# Patient Record
Sex: Female | Born: 1970 | Race: Black or African American | Hispanic: No | Marital: Married | Smoking: Never smoker
Health system: Southern US, Community
[De-identification: ages and names within clinical notes are randomized; demographics above are authoritative.]

## PROBLEM LIST (undated history)

## (undated) DIAGNOSIS — J302 Other seasonal allergic rhinitis: Secondary | ICD-10-CM

## (undated) DIAGNOSIS — Z9889 Other specified postprocedural states: Secondary | ICD-10-CM

## (undated) DIAGNOSIS — Z332 Encounter for elective termination of pregnancy: Secondary | ICD-10-CM

## (undated) DIAGNOSIS — I10 Essential (primary) hypertension: Secondary | ICD-10-CM

## (undated) DIAGNOSIS — R112 Nausea with vomiting, unspecified: Secondary | ICD-10-CM

## (undated) HISTORY — PX: BREAST LUMPECTOMY: SHX2

## (undated) HISTORY — DX: Essential (primary) hypertension: I10

## (undated) HISTORY — PX: CHOLECYSTECTOMY: SHX55

---
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
## 2003-06-14 HISTORY — PX: BREAST EXCISIONAL BIOPSY: SUR124

## 2011-05-16 LAB — LIPID PANEL
HDL: 59 mg/dL (ref 35–70)
LDL Cholesterol: 98 mg/dL

## 2011-05-23 ENCOUNTER — Other Ambulatory Visit (HOSPITAL_BASED_OUTPATIENT_CLINIC_OR_DEPARTMENT_OTHER): Payer: Self-pay | Admitting: Unknown Physician Specialty

## 2011-05-23 DIAGNOSIS — Z1231 Encounter for screening mammogram for malignant neoplasm of breast: Secondary | ICD-10-CM

## 2011-05-24 ENCOUNTER — Ambulatory Visit (HOSPITAL_BASED_OUTPATIENT_CLINIC_OR_DEPARTMENT_OTHER)
Admission: RE | Admit: 2011-05-24 | Discharge: 2011-05-24 | Disposition: A | Discharge status: Home / Self Care | Payer: Private Health Insurance - Indemnity | Source: Ambulatory Visit | Attending: Unknown Physician Specialty | Admitting: Unknown Physician Specialty

## 2011-05-24 DIAGNOSIS — Z1231 Encounter for screening mammogram for malignant neoplasm of breast: Secondary | ICD-10-CM | POA: Insufficient documentation

## 2011-05-26 ENCOUNTER — Other Ambulatory Visit: Payer: Self-pay | Admitting: Unknown Physician Specialty

## 2011-05-26 DIAGNOSIS — R928 Other abnormal and inconclusive findings on diagnostic imaging of breast: Secondary | ICD-10-CM

## 2011-06-09 ENCOUNTER — Ambulatory Visit
Admission: RE | Admit: 2011-06-09 | Discharge: 2011-06-09 | Disposition: A | Discharge status: Home / Self Care | Payer: Private Health Insurance - Indemnity | Source: Ambulatory Visit | Attending: Unknown Physician Specialty | Admitting: Unknown Physician Specialty

## 2011-06-09 DIAGNOSIS — R928 Other abnormal and inconclusive findings on diagnostic imaging of breast: Secondary | ICD-10-CM

## 2011-09-13 ENCOUNTER — Ambulatory Visit (INDEPENDENT_AMBULATORY_CARE_PROVIDER_SITE_OTHER): Payer: Private Health Insurance - Indemnity | Admitting: Physician Assistant

## 2011-09-13 ENCOUNTER — Encounter: Payer: Self-pay | Admitting: Physician Assistant

## 2011-09-13 VITALS — BP 138/91 | HR 78 | Wt 275.0 lb

## 2011-09-13 DIAGNOSIS — Z23 Encounter for immunization: Secondary | ICD-10-CM

## 2011-09-13 DIAGNOSIS — I10 Essential (primary) hypertension: Secondary | ICD-10-CM

## 2011-09-13 DIAGNOSIS — Z0000 Encounter for general adult medical examination without abnormal findings: Secondary | ICD-10-CM

## 2011-09-13 DIAGNOSIS — R5383 Other fatigue: Secondary | ICD-10-CM

## 2011-09-13 DIAGNOSIS — R635 Abnormal weight gain: Secondary | ICD-10-CM

## 2011-09-13 MED ORDER — LOSARTAN POTASSIUM 50 MG PO TABS: 50.0000 mg | ORAL_TABLET | Freq: Every day | ORAL | Status: DC

## 2011-09-13 NOTE — Patient Instructions (Addendum)
Will get TSH and call with results. Keep a balanced diet low in carbs and high in protein. Keep doing Zumba and add another day if possible. Watch sodium intake especially when going out to eat. Start losartan and follow up in 2-3 months for bp recheck. Start Calcium 500mg  twice a day or 4 servings of dairy daily. Bring in labs drawn to scan into our system.  Gave Tdap.  Calorie Counting Diet A calorie counting diet requires you to eat the number of calories that are right for you in a day. Calories are the measurement of how much energy you get from the food you eat. Eating the right amount of calories is important for staying at a healthy weight. If you eat too many calories, your body will store them as fat and you may gain weight. If you eat too few calories, you may lose weight. Counting the number of calories you eat during a day will help you know if you are eating the right amount. A Registered Dietitian can determine how many calories you need in a day. The amount of calories needed varies from person to person. If your goal is to lose weight, you will need to eat fewer calories. Losing weight can benefit you if you are overweight or have health problems such as heart disease, high blood pressure, or diabetes. If your goal is to gain weight, you will need to eat more calories. Gaining weight may be necessary if you have a certain health problem that causes your body to need more energy. TIPS Whether you are increasing or decreasing the number of calories you eat during a day, it may be hard to get used to changes in what you eat and drink. The following are tips to help you keep track of the number of calories you eat.  Measure foods at home with measuring cups. This helps you know the amount of food and number of calories you are eating.   Restaurants often serve food in amounts that are larger than 1 serving. While eating out, estimate how many servings of a food you are given. For example, a  serving of cooked rice is  cup or about the size of half of a fist. Knowing serving sizes will help you be aware of how much food you are eating at restaurants.   Ask for smaller portion sizes or child-size portions at restaurants.   Plan to eat half of a meal at a restaurant. Take the rest home or share the other half with a friend.   Read the Nutrition Facts panel on food labels for calorie content and serving size. You can find out how many servings are in a package, the size of a serving, and the number of calories each serving has.   For example, a package might contain 3 cookies. The Nutrition Facts panel on that package says that 1 serving is 1 cookie. Below that, it will say there are 3 servings in the container. The calories section of the Nutrition Facts label says there are 90 calories. This means there are 90 calories in 1 cookie (1 serving). If you eat 1 cookie you have eaten 90 calories. If you eat all 3 cookies, you have eaten 270 calories (3 servings x 90 calories = 270 calories).  The list below tells you how big or small some common portion sizes are.  1 oz.........4 stacked dice.   3 oz........Marland KitchenDeck of cards.   1 tsp.......Marland KitchenTip of little finger.  1 tbs......Marland KitchenMarland KitchenThumb.   2 tbs.......Marland KitchenGolf ball.    cup......Marland KitchenHalf of a fist.   1 cup.......Marland KitchenA fist.  KEEP A FOOD LOG Write down every food item you eat, the amount you eat, and the number of calories in each food you eat during the day. At the end of the day, you can add up the total number of calories you have eaten. It may help to keep a list like the one below. Find out the calorie information by reading the Nutrition Facts panel on food labels. Breakfast  Bran cereal (1 cup, 110 calories).   Fat-free milk ( cup, 45 calories).  Snack  Apple (1 medium, 80 calories).  Lunch  Spinach (1 cup, 20 calories).   Tomato ( medium, 20 calories).   Chicken breast strips (3 oz, 165 calories).   Shredded cheddar cheese (  cup, 110 calories).   Light [place] dressing (2 tbs, 60 calories).   Whole-wheat bread (1 slice, 80 calories).   Tub margarine (1 tsp, 35 calories).   Vegetable soup (1 cup, 160 calories).  Dinner  Pork chop (3 oz, 190 calories).   Brown rice (1 cup, 215 calories).   Steamed broccoli ( cup, 20 calories).   Strawberries (1  cup, 65 calories).   Whipped cream (1 tbs, 50 calories).  Daily Calorie Total: 1425 Document Released: 05/30/2005 Document Revised: 05/19/2011 Document Reviewed: 11/24/2006 [facility] Patient Information 2012 [place], [place].

## 2011-09-13 NOTE — Progress Notes (Signed)
Subjective:    Patient ID: Desiree Lawson, female    DOB: [birth year], 41 y.o.   MRN: [id number]  HPI    Review of Systems     Objective:   Physical Exam        Assessment & Plan:   Subjective:     Desiree Lawson is a 41 y.o. female and is here for a comprehensive physical exam. The patient reports problems - she has not taken her rampril due to cough and cramps in 6 months and she is concerned with her weight gain and faitgue.  She does exercise once a week with a Zumba class and tries to walk some throughout the week. She has stopped drinking sodas for the past 2 months and seen no weight loss. She seems tired thoughout the week. She denies any heat or cold intolerances or hair loss. She does not diet but she tries to eat vegetables and limit carbohydrates. She states that when she takes rampril she has a stubborn cough and causes muscle cramps all over her body. She has not used in 6 months but she states she has limited her salt intake.   History   Social History  . Marital Status: Married    Spouse Name: N/A    Number of Children: N/A  . Years of Education: N/A   Occupational History  . Not on file.   Social History Main Topics  . Smoking status: Never Smoker   . Smokeless tobacco: Not on file  . Alcohol Use: Yes  . Drug Use: No  . Sexually Active: Yes    Birth Control/ Protection: Pill   Other Topics Concern  . Not on file   Social History Narrative  . No narrative on file   No health maintenance topics applied.  The following portions of the patient's history were reviewed and updated as appropriate: allergies, current medications, past family history, past medical history, past social history, past surgical history and problem list.  Review of Systems A comprehensive review of systems was negative.   Objective:    BP 138/91  Pulse 78  Wt 275 lb (124.739 kg) General appearance: alert, cooperative, appears stated age and moderately  obese Head: Normocephalic, without obvious abnormality, atraumatic Eyes: conjunctivae/corneas clear. PERRL, EOM's intact. Fundi benign. Ears: normal TM's and external ear canals both ears Nose: Nares normal. Septum midline. Mucosa normal. No drainage or sinus tenderness. Throat: lips, mucosa, and tongue normal; teeth and gums normal Neck: no adenopathy, no carotid bruit, no JVD, supple, symmetrical, trachea midline and thyroid not enlarged, symmetric, no tenderness/mass/nodules Back: symmetric, no curvature. ROM normal. No CVA tenderness. Lungs: clear to auscultation bilaterally Heart: regular rate and rhythm, S1, S2 normal, no murmur, click, rub or gallop Abdomen: soft, non-tender; bowel sounds normal; no masses,  no organomegaly Extremities: extremities normal, atraumatic, no cyanosis or edema Pulses: 2+ and symmetric Skin: Skin color, texture, turgor normal. No rashes or lesions Lymph nodes: Cervical, supraclavicular, and axillary nodes normal. Neurologic: Grossly normal    Assessment:    Healthy female exam.      Plan:    Weight gain/Fatigue-Will get TSH and call with results. Exercise can also help with fatigue. Handout was given on calorie counting. Will discuss more weight loss options at next visit in 2-3 months.   Hypertension- Changed Rampril to Losartan 50mg  once a day. Watch sodium intake especially when going out to eat. Will recheck in 2-3 months.  CPE-Labs were drawn for patient at her  work place. She will get a copy and bring to office to scan in.  Keep a balanced diet low in carbs and high in protein. Keep doing Zumba and add another day if possible.Start Calcium 500mg  twice a day or 4 servings of dairy daily. Bring in labs drawn to scan into our system. Gave Tdap today. See After Visit Summary for Counseling Recommendations

## 2011-11-16 ENCOUNTER — Encounter: Payer: Self-pay | Admitting: Physician Assistant

## 2011-11-16 ENCOUNTER — Ambulatory Visit (INDEPENDENT_AMBULATORY_CARE_PROVIDER_SITE_OTHER): Payer: Private Health Insurance - Indemnity | Admitting: Physician Assistant

## 2011-11-16 VITALS — BP 138/83 | HR 79 | Wt 274.0 lb

## 2011-11-16 DIAGNOSIS — Z79899 Other long term (current) drug therapy: Secondary | ICD-10-CM

## 2011-11-16 DIAGNOSIS — I10 Essential (primary) hypertension: Secondary | ICD-10-CM

## 2011-11-16 LAB — COMPLETE METABOLIC PANEL WITH GFR
Albumin: 3.9 g/dL (ref 3.5–5.2)
Alkaline Phosphatase: 53 U/L (ref 39–117)
Calcium: 9.3 mg/dL (ref 8.4–10.5)
Chloride: 106 mEq/L (ref 96–112)
GFR, Est Non African American: 80 mL/min
Glucose, Bld: 87 mg/dL (ref 70–99)
Potassium: 4.4 mEq/L (ref 3.5–5.3)
Sodium: 140 mEq/L (ref 135–145)
Total Protein: 7.1 g/dL (ref 6.0–8.3)

## 2011-11-16 MED ORDER — LOSARTAN POTASSIUM 50 MG PO TABS: 50.0000 mg | ORAL_TABLET | Freq: Two times a day (BID) | ORAL | Status: DC

## 2011-11-16 NOTE — Progress Notes (Signed)
  Subjective:    Patient ID: Desiree Lawson, female    DOB: September 18, 1970, 41 y.o.   MRN: [id number]  HPI Patient is here to follow up on HTN. We started Cozaar 50mg  once a day to replace Rampril due to cough and muscle aches. BP still up a little today but stayed stable. Denies any side effects or problems. Denies any chest pain, palpitations, or swelling. Patient has been trying to lose weight. She has started walking 3-4 times a week and watching what she eats. She has lost a pound as of today. She is feeling great today.   Review of Systems     Objective:   Physical Exam  Constitutional: She is oriented to person, place, and time. She appears well-developed and well-nourished.  HENT:  Head: Normocephalic and atraumatic.  Cardiovascular: Normal rate, regular rhythm and normal heart sounds.   Pulmonary/Chest: Effort normal and breath sounds normal. She has no wheezes.  Neurological: She is alert and oriented to person, place, and time.  Skin: Skin is warm and dry.       No swelling in lower extremities. Pedal pulses 2+ bilaterally.  Psychiatric: She has a normal mood and affect. Her behavior is normal.          Assessment & Plan:  HNT-will increase Cozaar 50 mg to twice a day. New prescription was sent to pharmacy. Patient reports again that she has had cholesterol as well as a diabetes screen done at her work. I told her that I would really like to have those numbers on file and if she could fax over the results. Today we will get a CMP to look at kidney function. Followup in 2 months.

## 2011-11-16 NOTE — Patient Instructions (Signed)
Increase to Cozaar 50mg  twice a day. Continue healthy eating habits and continue to walk. Will call with labs results.

## 2011-11-17 NOTE — Progress Notes (Signed)
Quick Note:  Call patient with results. Let them know all labs are within normal limits. ______ 

## 2011-11-21 ENCOUNTER — Encounter: Payer: Self-pay | Admitting: *Deleted

## 2011-12-09 ENCOUNTER — Other Ambulatory Visit: Payer: Self-pay | Admitting: *Deleted

## 2011-12-09 MED ORDER — LOSARTAN POTASSIUM 50 MG PO TABS: 50.0000 mg | ORAL_TABLET | Freq: Two times a day (BID) | ORAL | Status: DC

## 2012-02-17 ENCOUNTER — Encounter: Payer: Self-pay | Admitting: Physician Assistant

## 2012-02-17 ENCOUNTER — Ambulatory Visit (INDEPENDENT_AMBULATORY_CARE_PROVIDER_SITE_OTHER): Payer: Private Health Insurance - Indemnity | Admitting: Physician Assistant

## 2012-02-17 VITALS — BP 129/87 | HR 86 | Ht 66.0 in | Wt 278.0 lb

## 2012-02-17 DIAGNOSIS — I10 Essential (primary) hypertension: Secondary | ICD-10-CM

## 2012-02-17 DIAGNOSIS — E669 Obesity, unspecified: Secondary | ICD-10-CM

## 2012-02-17 MED ORDER — LOSARTAN POTASSIUM 50 MG PO TABS: 50.0000 mg | ORAL_TABLET | Freq: Two times a day (BID) | ORAL | Status: DC

## 2012-02-17 MED ORDER — NORGESTIM-ETH ESTRAD TRIPHASIC 0.18/0.215/0.25 MG-35 MCG PO TABS: 1.0000 | ORAL_TABLET | Freq: Every day | ORAL | Status: DC

## 2012-02-17 NOTE — Patient Instructions (Signed)
Keep up trying trying to lose weight and eat healthier. F/U in 6 months.

## 2012-02-17 NOTE — Progress Notes (Signed)
  Subjective:    Patient ID: Desiree Lawson, female    DOB: 1970-11-24, 41 y.o.   MRN: [id number]  HPI Patient presents to the follow up on hypertension. She feels great and has no concerns. This is an ongoing problem and has been controlled with Cozaar 50mg  BID. She denies any CP, SOB, palpitations, HA's or lower leg edema. She has not been exercising like she should and has gain 4 lbs from last visit. She is trying to watch what she eats but has not been successful.   I did get her labs from work and cholesterol, glucose looked great.    Review of Systems     Objective:   Physical Exam  Constitutional: She is oriented to person, place, and time. She appears well-developed and well-nourished.       Obesity.  HENT:  Head: Normocephalic and atraumatic.  Cardiovascular: Normal rate, regular rhythm, normal heart sounds and intact distal pulses.   Pulmonary/Chest: Effort normal and breath sounds normal. She has no wheezes.  Neurological: She is alert and oriented to person, place, and time.  Skin: Skin is warm and dry.  Psychiatric: She has a normal mood and affect. Her behavior is normal.          Assessment & Plan:  HTN/Obesity- Refilled Cozaar for 6 months. F/U in 6 months. She get's annual labs done at work and they are going to be preformed in October. I told her to bring [place] a copy so we could follow up accordingly. Reminded her about diet and exercise. I encourage her to set goals of a week of exercise. I offered Nutrition counseling but patient did not want to do that today. I really think she could get off BP meds if she lost weight and losing weight would decrease likelyhood of diabetes/cholesterol problems.

## 2012-02-23 LAB — LIPID PANEL: LDL Cholesterol: 96 mg/dL

## 2012-03-02 ENCOUNTER — Other Ambulatory Visit: Payer: Self-pay | Admitting: Physician Assistant

## 2012-03-02 MED ORDER — LOSARTAN POTASSIUM 100 MG PO TABS: ORAL_TABLET | ORAL | Status: DC

## 2012-03-02 NOTE — Progress Notes (Signed)
LMOM informing Pt  

## 2012-03-02 NOTE — Progress Notes (Signed)
Let pt know that rx for 100mg  losartan was sent to pharmacy. Take 1/2 tab twice a day.

## 2012-03-05 ENCOUNTER — Encounter: Payer: Self-pay | Admitting: *Deleted

## 2012-08-17 ENCOUNTER — Ambulatory Visit: Payer: Private Health Insurance - Indemnity | Admitting: Physician Assistant

## 2012-08-31 ENCOUNTER — Ambulatory Visit: Payer: Private Health Insurance - Indemnity | Admitting: Physician Assistant

## 2012-09-07 ENCOUNTER — Ambulatory Visit: Payer: Private Health Insurance - Indemnity | Admitting: Physician Assistant

## 2012-09-21 ENCOUNTER — Ambulatory Visit (INDEPENDENT_AMBULATORY_CARE_PROVIDER_SITE_OTHER): Payer: Private Health Insurance - Indemnity | Admitting: Physician Assistant

## 2012-09-21 ENCOUNTER — Encounter: Payer: Self-pay | Admitting: Physician Assistant

## 2012-09-21 VITALS — BP 153/95 | HR 74 | Wt 273.0 lb

## 2012-09-21 DIAGNOSIS — Z1239 Encounter for other screening for malignant neoplasm of breast: Secondary | ICD-10-CM

## 2012-09-21 DIAGNOSIS — I10 Essential (primary) hypertension: Secondary | ICD-10-CM

## 2012-09-21 DIAGNOSIS — E669 Obesity, unspecified: Secondary | ICD-10-CM

## 2012-09-21 MED ORDER — LORCASERIN HCL 10 MG PO TABS: 10.0000 mg | ORAL_TABLET | Freq: Two times a day (BID) | ORAL | Status: DC

## 2012-09-21 MED ORDER — LOSARTAN POTASSIUM-HCTZ 100-25 MG PO TABS: 1.0000 | ORAL_TABLET | Freq: Every day | ORAL | Status: DC

## 2012-09-21 NOTE — Progress Notes (Addendum)
  Subjective:    Patient ID: Desiree Lawson, female    DOB: [birth year], 42 y.o.   MRN: [id number]  Hypertension This is a chronic problem. The current episode started more than 1 year ago. The problem has been gradually worsening since onset. The problem is uncontrolled. Associated symptoms include anxiety. Pertinent negatives include no blurred vision, chest pain, headaches, malaise/fatigue, neck pain, orthopnea, palpitations, peripheral edema, PND, shortness of breath or sweats. There are no associated agents to hypertension. Risk factors for coronary artery disease include obesity, stress and family history. Past treatments include ACE inhibitors and angiotensin blockers. The current treatment provides moderate improvement. Compliance problems include exercise.  There is no history of angina, CAD/MI, heart failure or a thyroid problem. There is no history of sleep apnea.      Review of Systems  Constitutional: Negative for malaise/fatigue.  HENT: Negative for neck pain.   Eyes: Negative for blurred vision.  Respiratory: Negative for shortness of breath.   Cardiovascular: Negative for chest pain, palpitations, orthopnea and PND.  Neurological: Negative for headaches.       Objective:   Physical Exam  Constitutional: She is oriented to person, place, and time. She appears well-developed and well-nourished.  Obese.  HENT:  Head: Normocephalic and atraumatic.  Neck: Normal range of motion. Neck supple. No thyromegaly present.  Cardiovascular: Normal rate, regular rhythm and normal heart sounds.   Pulmonary/Chest: Effort normal and breath sounds normal.  Neurological: She is alert and oriented to person, place, and time.  Skin: Skin is warm and dry.  Psychiatric: She has a normal mood and affect. Her behavior is normal.          Assessment & Plan:  HTN/Obesity- added diuretic. Stop cozaar. STart hyzaar. Follow up in 2 months for BP rehceck. Discussed diet and weight loss.  Added belviq. Discussed mechanism of action and that it can be used long term. Gave free samples. Talked about using in combination with diet and exercise. Reminded of low salt diet. Needs CPE discussed with patient to make for next visit.

## 2012-09-21 NOTE — Patient Instructions (Signed)
2 months.   Remind of low salt diet.  30 minutes a day and decreased calories by 600.

## 2012-11-26 ENCOUNTER — Ambulatory Visit: Payer: Private Health Insurance - Indemnity | Admitting: Physician Assistant

## 2012-12-17 ENCOUNTER — Encounter: Payer: Self-pay | Admitting: Physician Assistant

## 2012-12-17 ENCOUNTER — Ambulatory Visit (INDEPENDENT_AMBULATORY_CARE_PROVIDER_SITE_OTHER): Payer: Private Health Insurance - Indemnity | Admitting: Physician Assistant

## 2012-12-17 VITALS — BP 153/92 | HR 80 | Wt 267.0 lb

## 2012-12-17 DIAGNOSIS — I10 Essential (primary) hypertension: Secondary | ICD-10-CM

## 2012-12-17 MED ORDER — AMLODIPINE BESYLATE 5 MG PO TABS: 5.0000 mg | ORAL_TABLET | Freq: Every day | ORAL | Status: DC

## 2012-12-17 MED ORDER — LOSARTAN POTASSIUM-HCTZ 100-25 MG PO TABS: 1.0000 | ORAL_TABLET | Freq: Every day | ORAL | Status: DC

## 2012-12-17 NOTE — Progress Notes (Signed)
  Subjective:    Patient ID: Desiree Lawson, female    DOB: 06/09/1971, 42 y.o.   MRN: [id number]  Hypertension This is a chronic problem. The current episode started more than 1 year ago. The problem is unchanged. The problem is uncontrolled. Associated symptoms include headaches. Pertinent negatives include no anxiety, blurred vision, chest pain, malaise/fatigue, neck pain, orthopnea, palpitations, peripheral edema, PND, shortness of breath or sweats. Agents associated with hypertension include oral contraceptives. Risk factors for coronary artery disease include family history, obesity, sedentary lifestyle and stress. Past treatments include ACE inhibitors, diuretics and angiotensin blockers. The current treatment provides mild improvement. Compliance problems include diet and exercise.  There is no history of angina, kidney disease or CAD/MI. There is no history of chronic renal disease.      Review of Systems  Constitutional: Negative for malaise/fatigue.  HENT: Negative for neck pain.   Eyes: Negative for blurred vision.  Respiratory: Negative for shortness of breath.   Cardiovascular: Negative for chest pain, palpitations, orthopnea and PND.  Neurological: Positive for headaches.       Objective:   Physical Exam  Constitutional: She is oriented to person, place, and time. She appears well-developed and well-nourished.  HENT:  Head: Normocephalic and atraumatic.  Cardiovascular: Normal rate, regular rhythm and normal heart sounds.   Pulmonary/Chest: Effort normal and breath sounds normal. She has no wheezes.  Neurological: She is alert and oriented to person, place, and time.  Skin: Skin is warm and dry.  Psychiatric: She has a normal mood and affect. Her behavior is normal.          Assessment & Plan:  Hypertension-add a calcium channel blocker Norvasc 2 blood pressure regimen today. Continue to take Hyzaar also. Discussed importance of low salt diet and increasing  fruits and vegetables. Patient also aware of importance of staying active. Followup in 2 months.

## 2012-12-17 NOTE — Patient Instructions (Signed)

## 2013-02-18 ENCOUNTER — Ambulatory Visit: Payer: Private Health Insurance - Indemnity | Admitting: Physician Assistant

## 2013-02-25 ENCOUNTER — Encounter: Payer: Self-pay | Admitting: Physician Assistant

## 2013-02-25 ENCOUNTER — Ambulatory Visit (INDEPENDENT_AMBULATORY_CARE_PROVIDER_SITE_OTHER): Payer: Private Health Insurance - Indemnity | Admitting: Physician Assistant

## 2013-02-25 VITALS — BP 132/84 | HR 86 | Wt 260.0 lb

## 2013-02-25 DIAGNOSIS — Z13220 Encounter for screening for lipoid disorders: Secondary | ICD-10-CM

## 2013-02-25 DIAGNOSIS — R252 Cramp and spasm: Secondary | ICD-10-CM

## 2013-02-25 DIAGNOSIS — Z131 Encounter for screening for diabetes mellitus: Secondary | ICD-10-CM

## 2013-02-25 DIAGNOSIS — I10 Essential (primary) hypertension: Secondary | ICD-10-CM

## 2013-02-25 LAB — LIPID PANEL
LDL Cholesterol: 98 mg/dL (ref 0–99)
Total CHOL/HDL Ratio: 3.2 Ratio
VLDL: 18 mg/dL (ref 0–40)

## 2013-02-25 LAB — COMPLETE METABOLIC PANEL WITH GFR
ALT: 30 U/L (ref 0–35)
Albumin: 4 g/dL (ref 3.5–5.2)
CO2: 29 mEq/L (ref 19–32)
Calcium: 10.1 mg/dL (ref 8.4–10.5)
Chloride: 99 mEq/L (ref 96–112)
GFR, Est African American: 79 mL/min
Glucose, Bld: 107 mg/dL — ABNORMAL HIGH (ref 70–99)
Potassium: 4.3 mEq/L (ref 3.5–5.3)
Sodium: 137 mEq/L (ref 135–145)
Total Bilirubin: 0.6 mg/dL (ref 0.3–1.2)
Total Protein: 7.7 g/dL (ref 6.0–8.3)

## 2013-02-25 MED ORDER — AMLODIPINE BESYLATE 5 MG PO TABS: 5.0000 mg | ORAL_TABLET | Freq: Every day | ORAL | Status: DC

## 2013-02-25 NOTE — Progress Notes (Signed)
  Subjective:    Patient ID: Desiree Lawson, female    DOB: 11-07-1970, 42 y.o.   MRN: [id number]  HPI Patient presents to the clinic to follow up on hypertension and to get biometric screening for work.   Pt has been having a lot of lower leg cramps. After going over medication pt never stopped cozaar and started on hyzaar as well as norvasc. She did not realize she was supposed to stop cozaar. She denies any CP, palpitations, SOB, Headaches, vision changes. She does not check BP at home. Cramps are off and on. NO trauma. She has been drinking more water. Nothing makes better or worse.   Pt needs some lab work for work.     Review of Systems     Objective:   Physical Exam  Constitutional: She is oriented to person, place, and time. She appears well-developed and well-nourished.  HENT:  Head: Normocephalic and atraumatic.  Cardiovascular: Normal rate, regular rhythm and normal heart sounds.   Pulmonary/Chest: Effort normal and breath sounds normal.  Neurological: She is alert and oriented to person, place, and time.  Skin: Skin is warm and dry.  Psychiatric: She has a normal mood and affect. Her behavior is normal.          Assessment & Plan:  Muscle cramps- I suspect coming from being on 2 ARB's unintentionally. Will check CMP today. Call if symptoms are not improving in next 2 weeks. Stay hydrated.  HTN- Pt instructed to stop cozaar. Continue on hyzaar and norvasc. Take BP at pharmacy for next month call if readings over 140/90. Reminded of low salt diet. Discussed to continue working out regularly. Follow up in 3 months.   Screening labs for cholesterol and diabetes were ordered. Waist circumference measured today 41 inches and documented.  Pt declined flu shot.

## 2013-02-26 ENCOUNTER — Other Ambulatory Visit: Payer: Self-pay | Admitting: Physician Assistant

## 2013-08-26 ENCOUNTER — Ambulatory Visit (INDEPENDENT_AMBULATORY_CARE_PROVIDER_SITE_OTHER): Payer: Private Health Insurance - Indemnity | Admitting: Physician Assistant

## 2013-08-26 ENCOUNTER — Encounter: Payer: Self-pay | Admitting: Physician Assistant

## 2013-08-26 VITALS — BP 152/80 | HR 87 | Wt 269.0 lb

## 2013-08-26 DIAGNOSIS — R740 Nonspecific elevation of levels of transaminase and lactic acid dehydrogenase [LDH]: Secondary | ICD-10-CM

## 2013-08-26 DIAGNOSIS — R7402 Elevation of levels of lactic acid dehydrogenase (LDH): Secondary | ICD-10-CM

## 2013-08-26 DIAGNOSIS — R7301 Impaired fasting glucose: Secondary | ICD-10-CM

## 2013-08-26 DIAGNOSIS — I10 Essential (primary) hypertension: Secondary | ICD-10-CM

## 2013-08-26 DIAGNOSIS — F439 Reaction to severe stress, unspecified: Secondary | ICD-10-CM

## 2013-08-26 DIAGNOSIS — R7401 Elevation of levels of liver transaminase levels: Secondary | ICD-10-CM

## 2013-08-26 DIAGNOSIS — Z1239 Encounter for other screening for malignant neoplasm of breast: Secondary | ICD-10-CM

## 2013-08-26 LAB — COMPLETE METABOLIC PANEL WITH GFR
ALT: 17 U/L (ref 0–35)
AST: 44 U/L — ABNORMAL HIGH (ref 0–37)
Albumin: 3.9 g/dL (ref 3.5–5.2)
Alkaline Phosphatase: 53 U/L (ref 39–117)
BILIRUBIN TOTAL: 0.4 mg/dL (ref 0.2–1.2)
BUN: 9 mg/dL (ref 6–23)
CO2: 28 meq/L (ref 19–32)
CREATININE: 0.86 mg/dL (ref 0.50–1.10)
Calcium: 9.1 mg/dL (ref 8.4–10.5)
Chloride: 103 mEq/L (ref 96–112)
GFR, EST NON AFRICAN AMERICAN: 85 mL/min
GLUCOSE: 93 mg/dL (ref 70–99)
Potassium: 4.9 mEq/L (ref 3.5–5.3)
Sodium: 137 mEq/L (ref 135–145)
TOTAL PROTEIN: 7 g/dL (ref 6.0–8.3)

## 2013-08-26 MED ORDER — AMLODIPINE BESYLATE 5 MG PO TABS: 5.0000 mg | ORAL_TABLET | Freq: Every day | ORAL | Status: DC

## 2013-08-26 MED ORDER — LOSARTAN POTASSIUM-HCTZ 100-25 MG PO TABS: 1.0000 | ORAL_TABLET | Freq: Every day | ORAL | Status: DC

## 2013-08-26 NOTE — Patient Instructions (Signed)
Will schedule for mammogram.  Stop OCP. Recheck BP in 1-2 months.  Need Pap.

## 2013-08-26 NOTE — Progress Notes (Signed)
   Subjective:    Patient ID: Desiree Lawson, female    DOB: May 10, 1971, 43 y.o.   MRN: [id number]  HPI Pt presents to the clinic for HTN recheck. Did not take medication this am b/c forgot. Admits to forgetting to take frequently. Has had a lot of recent stressors. Mother dx with benign brain tumor, mother-in-law had back surgery, grandmother dx with alzheimer's and dementia. She feels like that could be affecting her BP. Denies any CP, palpitations, SOB, vision changes or headaches.    Review of Systems     Objective:   Physical Exam  Constitutional: She is oriented to person, place, and time. She appears well-developed and well-nourished.  Obese.   HENT:  Head: Normocephalic and atraumatic.  Cardiovascular: Normal rate, regular rhythm and normal heart sounds.   Pulmonary/Chest: Effort normal and breath sounds normal.  Neurological: She is alert and oriented to person, place, and time.  Skin: Skin is dry.  Psychiatric: She has a normal mood and affect. Her behavior is normal.          Assessment & Plan:  HTN- pt did not take BP medications this morning. Not sure how much BP elevation is due to no medication and stress. Discussed stopping OCP medication and seeing how that affects BP controll. Pt is in agreement. Encouraged use of condomns. Pt is considering a tubal ligation. Refill for meds sent for 1 month. Discussed taking for next office visit to get an accurate reading. Follow up 1-2 months.   Elevated ALT and glucose at last visit- will recheck today.   Stress- pt does have a lot going on. Pt does not want to entertain any more medications. Encouraged regular exercise and deep breathing.   Needs pap smear. Pt aware to schedule.  Will schedule mammogram.

## 2013-09-12 ENCOUNTER — Ambulatory Visit (INDEPENDENT_AMBULATORY_CARE_PROVIDER_SITE_OTHER): Payer: Private Health Insurance - Indemnity

## 2013-09-12 DIAGNOSIS — Z1239 Encounter for other screening for malignant neoplasm of breast: Secondary | ICD-10-CM

## 2013-09-23 ENCOUNTER — Ambulatory Visit: Payer: Private Health Insurance - Indemnity | Admitting: Physician Assistant

## 2013-09-30 ENCOUNTER — Other Ambulatory Visit (HOSPITAL_COMMUNITY)
Admission: RE | Admit: 2013-09-30 | Discharge: 2013-09-30 | Disposition: A | Discharge status: Home / Self Care | Payer: Private Health Insurance - Indemnity | Source: Ambulatory Visit | Attending: Family Medicine | Admitting: Family Medicine

## 2013-09-30 ENCOUNTER — Ambulatory Visit (INDEPENDENT_AMBULATORY_CARE_PROVIDER_SITE_OTHER): Payer: Private Health Insurance - Indemnity | Admitting: Physician Assistant

## 2013-09-30 ENCOUNTER — Encounter: Payer: Self-pay | Admitting: Physician Assistant

## 2013-09-30 VITALS — BP 143/86 | HR 83 | Ht 66.0 in | Wt 268.0 lb

## 2013-09-30 DIAGNOSIS — Z01419 Encounter for gynecological examination (general) (routine) without abnormal findings: Secondary | ICD-10-CM | POA: Insufficient documentation

## 2013-09-30 DIAGNOSIS — Z1151 Encounter for screening for human papillomavirus (HPV): Secondary | ICD-10-CM | POA: Insufficient documentation

## 2013-09-30 DIAGNOSIS — I10 Essential (primary) hypertension: Secondary | ICD-10-CM

## 2013-09-30 NOTE — Addendum Note (Signed)
Addended by: Donella Stade on: 09/30/2013 08:48 AM   Modules accepted: Level of Service

## 2013-09-30 NOTE — Progress Notes (Signed)
   Subjective:    Patient ID: Desiree Lawson, female    DOB: 15-Aug-1970, 43 y.o.   MRN: [id number]  HPI Pt is a 43 yo female who presents for BP follow up and pap smear.   HTN- she did stop OCP but admits she has not taken her BP medications in 4 days. She denies any CP, palpitations, headaches or vision changes. She does not check BP.   She needs pap today. Denies any problems. LMP 08/22/13. No hx of abnormal pap smears. Last pap was 2012 and normal.    Review of Systems  All other systems reviewed and are negative.      Objective:   Physical Exam  Constitutional: She is oriented to person, place, and time. She appears well-developed and well-nourished.  HENT:  Head: Normocephalic and atraumatic.  Cardiovascular: Normal rate, regular rhythm and normal heart sounds.   Pulmonary/Chest: Effort normal and breath sounds normal.  Genitourinary: Vagina normal. No vaginal discharge found.  External vagina normal. Cervical os no lesions or polyps. No adenxal tenderness.   Neurological: She is alert and oriented to person, place, and time.  Psychiatric: She has a normal mood and affect. Her behavior is normal.          Assessment & Plan:  HTN- not taking BP medications. Discussed importance of taking daily. Follow up after being on BP with nurse visit BP recheck. Will adjust accordingly. signifant drop in BP taking off OCP. May can stop one BP medications.   Pap smear done today. Pt declined STd testing.

## 2013-10-14 ENCOUNTER — Ambulatory Visit: Payer: Private Health Insurance - Indemnity | Admitting: Physician Assistant

## 2013-10-28 ENCOUNTER — Ambulatory Visit: Payer: Private Health Insurance - Indemnity

## 2013-10-29 ENCOUNTER — Ambulatory Visit (INDEPENDENT_AMBULATORY_CARE_PROVIDER_SITE_OTHER): Payer: Private Health Insurance - Indemnity | Admitting: Physician Assistant

## 2013-10-29 ENCOUNTER — Encounter: Payer: Self-pay | Admitting: *Deleted

## 2013-10-29 VITALS — BP 143/78 | HR 76 | Wt 246.0 lb

## 2013-10-29 DIAGNOSIS — I10 Essential (primary) hypertension: Secondary | ICD-10-CM

## 2013-10-29 NOTE — Progress Notes (Signed)
   Subjective:    Patient ID: Desiree Lawson, female    DOB: August 28, 1970, 43 y.o.   MRN: [id number] Pt in for BP check. Pt stated that she had taken her medications about 15 min prior to her appt this morning. Beatris Ship, CMA HPI    Review of Systems     Objective:   Physical Exam        Assessment & Plan:  Continues to be above goal of 140/90 but just barely. Pt did just take medication and likely has not lower BP to full capacity. Sent nurse msg to call pt and have her get some reading after taking medication for at least 2 hours after and see if they are below goal. Please call or fax BP log. Continue on norvasc and HYzaar.

## 2013-10-29 NOTE — Progress Notes (Signed)
Please call pt: BP still barely above goal but BP medication was not likely in system fully. Please check BP during day at least 1 hours after taking BP medication and log some readings. Can go to local pharmacy. Call or fax BP readings. For now stay on same dose. Recheck in 3 months unless BP running above 140/90.

## 2013-12-02 ENCOUNTER — Other Ambulatory Visit: Payer: Self-pay | Admitting: Physician Assistant

## 2015-02-18 ENCOUNTER — Encounter: Payer: Private Health Insurance - Indemnity | Admitting: Physician Assistant

## 2015-02-27 ENCOUNTER — Encounter: Payer: Self-pay | Admitting: Physician Assistant

## 2015-02-27 ENCOUNTER — Ambulatory Visit (INDEPENDENT_AMBULATORY_CARE_PROVIDER_SITE_OTHER): Payer: Private Health Insurance - Indemnity | Admitting: Physician Assistant

## 2015-02-27 VITALS — BP 170/80 | HR 76 | Ht 66.0 in | Wt 259.0 lb

## 2015-02-27 DIAGNOSIS — Z0000 Encounter for general adult medical examination without abnormal findings: Secondary | ICD-10-CM

## 2015-02-27 DIAGNOSIS — Z1239 Encounter for other screening for malignant neoplasm of breast: Secondary | ICD-10-CM | POA: Diagnosis not present

## 2015-02-27 DIAGNOSIS — Z131 Encounter for screening for diabetes mellitus: Secondary | ICD-10-CM

## 2015-02-27 DIAGNOSIS — R5383 Other fatigue: Secondary | ICD-10-CM | POA: Insufficient documentation

## 2015-02-27 DIAGNOSIS — Z13220 Encounter for screening for lipoid disorders: Secondary | ICD-10-CM | POA: Diagnosis not present

## 2015-02-27 LAB — COMPLETE METABOLIC PANEL WITH GFR
ALBUMIN: 4 g/dL (ref 3.6–5.1)
ALT: 17 U/L (ref 6–29)
AST: 48 U/L — ABNORMAL HIGH (ref 10–30)
Alkaline Phosphatase: 48 U/L (ref 33–115)
BILIRUBIN TOTAL: 0.4 mg/dL (ref 0.2–1.2)
BUN: 12 mg/dL (ref 7–25)
CO2: 25 mmol/L (ref 20–31)
CREATININE: 0.93 mg/dL (ref 0.50–1.10)
Calcium: 9 mg/dL (ref 8.6–10.2)
Chloride: 103 mmol/L (ref 98–110)
GFR, Est African American: 88 mL/min (ref >=60)
GFR, Est Non African American: 76 mL/min (ref >=60)
GLUCOSE: 81 mg/dL (ref 65–99)
Potassium: 4.4 mmol/L (ref 3.5–5.3)
SODIUM: 138 mmol/L (ref 135–146)
TOTAL PROTEIN: 7 g/dL (ref 6.1–8.1)

## 2015-02-27 LAB — LIPID PANEL
CHOL/HDL RATIO: 3.1 ratio (ref <=5.0)
CHOLESTEROL: 160 mg/dL (ref 125–200)
HDL: 51 mg/dL (ref >=46)
LDL Cholesterol: 101 mg/dL (ref <130)
TRIGLYCERIDES: 41 mg/dL (ref <150)
VLDL: 8 mg/dL (ref <30)

## 2015-02-27 LAB — CBC WITH DIFFERENTIAL/PLATELET
BASOS ABS: 0 10*3/uL (ref 0.0–0.1)
Basophils Relative: 0 % (ref 0–1)
EOS PCT: 1 % (ref 0–5)
Eosinophils Absolute: 0 10*3/uL (ref 0.0–0.7)
HEMATOCRIT: 39.3 % (ref 36.0–46.0)
Hemoglobin: 12.7 g/dL (ref 12.0–15.0)
LYMPHS ABS: 1.7 10*3/uL (ref 0.7–4.0)
LYMPHS PCT: 42 % (ref 12–46)
MCH: 29.4 pg (ref 26.0–34.0)
MCHC: 32.3 g/dL (ref 30.0–36.0)
MCV: 91 fL (ref 78.0–100.0)
MONO ABS: 0.6 10*3/uL (ref 0.1–1.0)
MONOS PCT: 14 % — AB (ref 3–12)
MPV: 9.4 fL (ref 8.6–12.4)
Neutro Abs: 1.8 10*3/uL (ref 1.7–7.7)
Neutrophils Relative %: 43 % (ref 43–77)
Platelets: 343 10*3/uL (ref 150–400)
RBC: 4.32 MIL/uL (ref 3.87–5.11)
RDW: 14.3 % (ref 11.5–15.5)
WBC: 4.1 10*3/uL (ref 4.0–10.5)

## 2015-02-27 MED ORDER — AMLODIPINE BESYLATE 5 MG PO TABS: ORAL_TABLET | ORAL | Status: DC

## 2015-02-27 MED ORDER — LOSARTAN POTASSIUM-HCTZ 100-25 MG PO TABS: 1.0000 | ORAL_TABLET | Freq: Every day | ORAL | Status: DC

## 2015-02-27 NOTE — Patient Instructions (Signed)

## 2015-02-27 NOTE — Progress Notes (Signed)
  Subjective:     Desiree Lawson is a 44 y.o. female and is here for a comprehensive physical exam. The patient reports no problems.  Social History   Social History  . Marital Status: Married    Spouse Name: N/A  . Number of Children: N/A  . Years of Education: N/A   Occupational History  . Not on file.   Social History Main Topics  . Smoking status: Never Smoker   . Smokeless tobacco: Not on file  . Alcohol Use: Yes  . Drug Use: No  . Sexual Activity: Yes    Birth Control/ Protection: Pill   Other Topics Concern  . Not on file   Social History Narrative   Health Maintenance  Topic Date Due  . HIV Screening  02/05/1988  . MAMMOGRAM  09/13/2014  . INFLUENZA VACCINE  02/26/2018 (Originally 01/12/2015)  . PAP SMEAR  09/30/2016  . TETANUS/TDAP  09/12/2021    The following portions of the patient's history were reviewed and updated as appropriate: allergies, current medications, past family history, past medical history, past social history, past surgical history and problem list.  Review of Systems A comprehensive review of systems was negative.   Objective:    BP 170/80 mmHg  Pulse 76  Ht 5\' 6"  (1.676 m)  Wt 259 lb (117.482 kg)  BMI 41.82 kg/m2 General appearance: alert, cooperative, appears stated age and mildly obese Head: Normocephalic, without obvious abnormality, atraumatic Eyes: conjunctivae/corneas clear. PERRL, EOM's intact. Fundi benign. Ears: normal TM's and external ear canals both ears Nose: Nares normal. Septum midline. Mucosa normal. No drainage or sinus tenderness. Throat: lips, mucosa, and tongue normal; teeth and gums normal Neck: no adenopathy, no carotid bruit, no JVD, supple, symmetrical, trachea midline and thyroid not enlarged, symmetric, no tenderness/mass/nodules Back: symmetric, no curvature. ROM normal. No CVA tenderness. Lungs: clear to auscultation bilaterally Heart: regular rate and rhythm, S1, S2 normal, no murmur, click,  rub or gallop Abdomen: soft, non-tender; bowel sounds normal; no masses,  no organomegaly Pelvic: cervix normal in appearance, external genitalia normal, no adnexal masses or tenderness, no cervical motion tenderness, uterus normal size, shape, and consistency and vagina normal without discharge Extremities: extremities normal, atraumatic, no cyanosis or edema Pulses: 2+ and symmetric Skin: Skin color, texture, turgor normal. No rashes or lesions Lymph nodes: Cervical, supraclavicular, and axillary nodes normal. Neurologic: Grossly normal    Assessment:    Healthy female exam.      Plan:    CPE- mammogram ordered. Fasting labs ordered. Vitamin D 800 units and calcium 1500mg  suggested today.   HtN- uncontrolled. Restarted all medication. Encouraged pt to be consisient. Follow up nurse visit is 4 weeks for recheck.    Obese/fatigue- discussed weight loss. Pt will start exercising and eating healthier. Can consider phentermine if BP gets under control. Fatigue could be from weight. Will check tsh, b12, vitamin D.  See After Visit Summary for Counseling Recommendations

## 2015-02-28 LAB — FERRITIN: Ferritin: 37 ng/mL (ref 10–291)

## 2015-02-28 LAB — VITAMIN B12: VITAMIN B 12: 886 pg/mL (ref 211–911)

## 2015-02-28 LAB — VITAMIN D 25 HYDROXY (VIT D DEFICIENCY, FRACTURES): Vit D, 25-Hydroxy: 11 ng/mL — ABNORMAL LOW (ref 30–100)

## 2015-02-28 LAB — TSH: TSH: 1.967 u[IU]/mL (ref 0.350–4.500)

## 2015-03-01 ENCOUNTER — Other Ambulatory Visit: Payer: Self-pay | Admitting: Physician Assistant

## 2015-03-01 ENCOUNTER — Encounter: Payer: Self-pay | Admitting: Physician Assistant

## 2015-03-01 DIAGNOSIS — E559 Vitamin D deficiency, unspecified: Secondary | ICD-10-CM | POA: Insufficient documentation

## 2015-03-01 MED ORDER — VITAMIN D (ERGOCALCIFEROL) 1.25 MG (50000 UNIT) PO CAPS: 50000.0000 [IU] | ORAL_CAPSULE | ORAL | Status: DC

## 2015-03-26 ENCOUNTER — Ambulatory Visit (INDEPENDENT_AMBULATORY_CARE_PROVIDER_SITE_OTHER): Payer: Managed Care, Other (non HMO) | Admitting: Physician Assistant

## 2015-03-26 VITALS — BP 109/67 | HR 83 | Resp 16 | Wt 257.0 lb

## 2015-03-26 DIAGNOSIS — I10 Essential (primary) hypertension: Secondary | ICD-10-CM | POA: Diagnosis not present

## 2015-03-26 NOTE — Progress Notes (Signed)
   Subjective:    Patient ID: Desiree Lawson, female    DOB: 1971-03-13, 44 y.o.   MRN: [id number]  HPI Patient here for blood pressure check and weight check. She has started the once a week Vit D rx; is exercising 4 x per week; watching food choices; feeling good.    Review of Systems     Objective:   Physical Exam        Assessment & Plan:  Blood pressure normotensive (taken x 2); weight loss of 2 pounds. Wants to have another nurse visit check in 4 weeks and then decide on course of action. PAK

## 2015-04-02 ENCOUNTER — Ambulatory Visit: Payer: Managed Care, Other (non HMO)

## 2015-04-14 ENCOUNTER — Ambulatory Visit (HOSPITAL_BASED_OUTPATIENT_CLINIC_OR_DEPARTMENT_OTHER)
Admission: RE | Admit: 2015-04-14 | Discharge: 2015-04-14 | Disposition: A | Discharge status: Home / Self Care | Payer: Managed Care, Other (non HMO) | Source: Ambulatory Visit | Attending: Physician Assistant | Admitting: Physician Assistant

## 2015-04-14 ENCOUNTER — Telehealth: Payer: Self-pay | Admitting: *Deleted

## 2015-04-14 ENCOUNTER — Other Ambulatory Visit: Payer: Self-pay | Admitting: Physician Assistant

## 2015-04-14 ENCOUNTER — Ambulatory Visit (HOSPITAL_BASED_OUTPATIENT_CLINIC_OR_DEPARTMENT_OTHER): Admission: RE | Admit: 2015-04-14 | Payer: Managed Care, Other (non HMO) | Source: Ambulatory Visit

## 2015-04-14 DIAGNOSIS — Z1231 Encounter for screening mammogram for malignant neoplasm of breast: Secondary | ICD-10-CM | POA: Diagnosis not present

## 2015-04-14 DIAGNOSIS — Z139 Encounter for screening, unspecified: Secondary | ICD-10-CM

## 2015-04-14 NOTE — Telephone Encounter (Signed)
Yes that blood work was over 6 weeks ago so if having problems need to evaluate.

## 2015-04-14 NOTE — Telephone Encounter (Signed)
Pt advised of recommendations & was transferred to scheduling.

## 2015-04-14 NOTE — Telephone Encounter (Signed)
Pt left vm stating that on her blood work from her last visit, we told her that there may have been some slight infection (monocytes were elevated).  She stated that she now has thrush & she did some research and said it can come from infection.  She wants to know if she needs to be seen.  Please advise.

## 2015-04-17 ENCOUNTER — Encounter: Payer: Self-pay | Admitting: Physician Assistant

## 2015-04-17 ENCOUNTER — Ambulatory Visit (INDEPENDENT_AMBULATORY_CARE_PROVIDER_SITE_OTHER): Payer: Managed Care, Other (non HMO) | Admitting: Physician Assistant

## 2015-04-17 VITALS — BP 148/82 | HR 58 | Ht 66.0 in | Wt 256.0 lb

## 2015-04-17 DIAGNOSIS — R7989 Other specified abnormal findings of blood chemistry: Secondary | ICD-10-CM

## 2015-04-17 DIAGNOSIS — B370 Candidal stomatitis: Secondary | ICD-10-CM | POA: Diagnosis not present

## 2015-04-17 LAB — CBC WITH DIFFERENTIAL/PLATELET
BASOS PCT: 0 % (ref 0–1)
Basophils Absolute: 0 10*3/uL (ref 0.0–0.1)
EOS PCT: 1 % (ref 0–5)
Eosinophils Absolute: 0.1 10*3/uL (ref 0.0–0.7)
HEMATOCRIT: 38.5 % (ref 36.0–46.0)
Hemoglobin: 12.8 g/dL (ref 12.0–15.0)
Lymphocytes Relative: 41 % (ref 12–46)
Lymphs Abs: 2.4 10*3/uL (ref 0.7–4.0)
MCH: 29.8 pg (ref 26.0–34.0)
MCHC: 33.2 g/dL (ref 30.0–36.0)
MCV: 89.7 fL (ref 78.0–100.0)
MONO ABS: 0.9 10*3/uL (ref 0.1–1.0)
MPV: 9.7 fL (ref 8.6–12.4)
Monocytes Relative: 16 % — ABNORMAL HIGH (ref 3–12)
Neutro Abs: 2.4 10*3/uL (ref 1.7–7.7)
Neutrophils Relative %: 42 % — ABNORMAL LOW (ref 43–77)
Platelets: 386 10*3/uL (ref 150–400)
RBC: 4.29 MIL/uL (ref 3.87–5.11)
RDW: 14.2 % (ref 11.5–15.5)
WBC: 5.8 10*3/uL (ref 4.0–10.5)

## 2015-04-17 MED ORDER — NYSTATIN 100000 UNIT/ML MT SUSP: 500000.0000 [IU] | Freq: Four times a day (QID) | OROMUCOSAL | Status: DC

## 2015-04-17 NOTE — Progress Notes (Signed)
   Subjective:    Patient ID: Desiree Lawson, female    DOB: 10-Feb-1971, 44 y.o.   MRN: [id number]  HPI  patient is a 44 year old female who presents to the clinic to discuss burning sensation in mouth with some white deposit along the buccal mucosa and sides of mouth. The symptoms started approximately 5 days ago. She has done some salt water gargles and symptoms seem to have improved. She denies any  More pain in the mouth or white stuff today. She denies using any  Antibiotics, nasal sprays, inhalers , or mouthwashes. She denies any fever, chills, sinus pressure, ear pain or cough.  She is not a diabetic.   Her last lab showed elevated monocytes. She comes in for recheck today.   Review of Systems  All other systems reviewed and are negative.      Objective:   Physical Exam  Constitutional: She is oriented to person, place, and time. She appears well-developed and well-nourished.  HENT:  Head: Normocephalic and atraumatic.  Right Ear: External ear normal.  Left Ear: External ear normal.  Nose: Nose normal.  Mouth/Throat: Oropharynx is clear and moist. No oropharyngeal exudate.  Eyes: Conjunctivae are normal. Right eye exhibits no discharge. Left eye exhibits no discharge.  Neck: Normal range of motion. Neck supple.  Cardiovascular: Normal rate, regular rhythm and normal heart sounds.   Pulmonary/Chest: Effort normal and breath sounds normal.  Lymphadenopathy:    She has no cervical adenopathy.  Neurological: She is alert and oriented to person, place, and time.  Psychiatric: She has a normal mood and affect. Her behavior is normal.          Assessment & Plan:  Thrush-  Symptoms have resolved and I do not see any  Physical presentation of thrush. I did give patient a nystatin mouth wash to use if symptoms  Reoccurring. I did give patient a handout of what could be causing these symptoms. I do not have any overt calls for thrush. Perhaps it could be dry mouth. She does  wear braces and is having regular mouth work done.  Monocytes, elevated- will recheck today.

## 2015-04-17 NOTE — Patient Instructions (Signed)
Thrush, Adult  Thrush, also called oral candidiasis, is a fungal infection that develops in the mouth and throat and on the tongue. It causes white patches to form on the mouth and tongue. Thrush is most common in older adults, but it can occur at any age.   Many cases of thrush are mild, but this infection can also be more serious. Thrush can be a recurring problem for people who have chronic illnesses or who take medicines that limit the body's ability to fight infection. Because these people have difficulty fighting infections, the fungus that causes thrush can spread throughout the body. This can cause life-threatening blood or organ infections.  CAUSES   Thrush is usually caused by a yeast called Candida albicans. This fungus is normally present in small amounts in the mouth and on other mucous membranes. It usually causes no harm. However, when conditions are present that allow the fungus to grow uncontrolled, it invades surrounding tissues and becomes an infection. Less often, other Candida species can also lead to thrush.   RISK FACTORS  Thrush is more likely to develop in the following people:  · People with an impaired ability to fight infection (weakened immune system).    · Older adults.    · People with HIV.    · People with diabetes.    · People with dry mouth (xerostomia).    · Pregnant women.    · People with poor dental care, especially those who have false teeth.    · People who use antibiotic medicines.    SIGNS AND SYMPTOMS   Thrush can be a mild infection that causes no symptoms. If symptoms develop, they may include:   · A burning feeling in the mouth and throat. This can occur at the start of a thrush infection.    · White patches that adhere to the mouth and tongue. The tissue around the patches may be red, raw, and painful. If rubbed (during tooth brushing, for example), the patches and the tissue of the mouth may bleed easily.    · A bad taste in the mouth or difficulty tasting foods.     · Cottony feeling in the mouth.    · Pain during eating and swallowing.  DIAGNOSIS   Your health care provider can usually diagnose thrush by looking in your mouth and asking you questions about your health.   TREATMENT   Medicines that help prevent the growth of fungi (antifungals) are the standard treatment for thrush. These medicines are either applied directly to the affected area (topical) or swallowed (oral). The treatment will depend on the severity of the condition.   Mild Thrush  Mild cases of thrush may clear up with the use of an antifungal mouth rinse or lozenges. Treatment usually lasts about 14 days.   Moderate to Severe Thrush  · More severe thrush infections that have spread to the esophagus are treated with an oral antifungal medicine. A topical antifungal medicine may also be used.    · For some severe infections, a treatment period longer than 14 days may be needed.    · Oral antifungal medicines are almost never used during pregnancy because the fetus may be harmed. However, if a pregnant woman has a rare, severe thrush infection that has spread to her blood, oral antifungal medicines may be used. In this case, the risk of harm to the mother and fetus from the severe thrush infection may be greater than the risk posed by the use of antifungal medicines.    Persistent or Recurrent Thrush  For cases of   thrush that do not go away or keep coming back, treatment may involve the following:   · Treatment may be needed twice as long as the symptoms last.    · Treatment will include both oral and topical antifungal medicines.    · People with weakened immune systems can take an antifungal medicine on a continuous basis to prevent thrush infections.    It is important to treat conditions that make you more likely to get thrush, such as diabetes or HIV.   HOME CARE INSTRUCTIONS   · Only take over-the-counter or prescription medicine as directed by your health care provider. Talk to your health care  provider about an over-the-counter medicine called gentian violet, which kills bacteria and fungi.    · Eat plain, unflavored yogurt as directed by your health care provider. Check the label to make sure the yogurt contains live cultures. This yogurt can help healthy bacteria grow in the mouth that can stop the growth of the fungus that causes thrush.    · Try these measures to help reduce the discomfort of thrush:      Drink cold liquids such as water or iced tea.      Try flavored ice treats or frozen juices.      Eat foods that are easy to swallow, such as gelatin, ice cream, or custard.      If the patches in your mouth are painful, try drinking from a straw.    · Rinse your mouth several times a day with a warm saltwater rinse. You can make the saltwater mixture with 1 tsp (6 g) of salt in 8 fl oz (0.2 L) of warm water.    · If you wear dentures, remove the dentures before going to bed, brush them vigorously, and soak them in a cleaning solution as directed by your health care provider.    · Women who are breastfeeding should clean their nipples with an antifungal medicine as directed by their health care provider. Dry the nipples after breastfeeding. Applying lanolin-containing body lotion may help relieve nipple soreness.    SEEK MEDICAL CARE IF:  · Your symptoms are getting worse or are not improving within 7 days of starting treatment.    · You have symptoms of spreading infection, such as white patches on the skin outside of the mouth.    · You are nursing and you have redness, burning, or pain in the nipples that is not relieved with treatment.    MAKE SURE YOU:  · Understand these instructions.  · Will watch your condition.  · Will get help right away if you are not doing well or get worse.     This information is not intended to replace advice given to you by your health care provider. Make sure you discuss any questions you have with your health care provider.     Document Released: 02/23/2004 Document  Revised: 06/20/2014 Document Reviewed: 12/31/2012  Elsevier Interactive Patient Education ©2016 [employer]

## 2015-04-27 ENCOUNTER — Ambulatory Visit: Payer: Managed Care, Other (non HMO)

## 2015-05-05 ENCOUNTER — Ambulatory Visit (INDEPENDENT_AMBULATORY_CARE_PROVIDER_SITE_OTHER): Payer: Managed Care, Other (non HMO) | Admitting: Physician Assistant

## 2015-05-05 VITALS — BP 135/76 | HR 75 | Resp 16 | Wt 256.0 lb

## 2015-05-05 DIAGNOSIS — R635 Abnormal weight gain: Secondary | ICD-10-CM

## 2015-05-05 NOTE — Progress Notes (Signed)
   Subjective:    Patient ID: Desiree Lawson, female    DOB: Oct 18, 1970, 44 y.o.   MRN: [id number]  HPIPatient here for BP and weight check. She is considering medication options to promote weight loss if her BP remains wnl.    Review of Systems     Objective:   Physical Exam        Assessment & Plan:  Patient's weight exact same as previous visit. Her BP is wnl. She will make appt. to discuss weight loss options.

## 2015-08-28 ENCOUNTER — Encounter: Payer: Self-pay | Admitting: Physician Assistant

## 2015-08-28 ENCOUNTER — Ambulatory Visit (INDEPENDENT_AMBULATORY_CARE_PROVIDER_SITE_OTHER): Payer: Managed Care, Other (non HMO) | Admitting: Physician Assistant

## 2015-08-28 DIAGNOSIS — I10 Essential (primary) hypertension: Secondary | ICD-10-CM

## 2015-08-28 DIAGNOSIS — E559 Vitamin D deficiency, unspecified: Secondary | ICD-10-CM | POA: Diagnosis not present

## 2015-08-28 MED ORDER — PHENTERMINE HCL 37.5 MG PO TABS: 37.5000 mg | ORAL_TABLET | Freq: Every day | ORAL | Status: DC

## 2015-08-28 MED ORDER — AMLODIPINE BESYLATE 5 MG PO TABS: ORAL_TABLET | ORAL | Status: DC

## 2015-08-28 MED ORDER — LOSARTAN POTASSIUM-HCTZ 100-25 MG PO TABS: 1.0000 | ORAL_TABLET | Freq: Every day | ORAL | Status: DC

## 2015-08-28 NOTE — Progress Notes (Signed)
   Subjective:    Patient ID: Desiree Lawson, female    DOB: 04/23/1971, 45 y.o.   MRN: [id number]  HPI Pt is a 45 yo female who presents to the clinic for 6 month follow up. Pt is taking Hyzaar and norvasc daily. No problems or complaints. No CP, palpitations, dizziness.   She wants to lose weight. Tried belviq in past and too expensive and not very effective. She is doing a bootcamp 3-4 times a week. She is exercising but she is not losing weight.    Review of Systems  All other systems reviewed and are negative.      Objective:   Physical Exam  Constitutional: She is oriented to person, place, and time. She appears well-developed and well-nourished.  Obesity.   HENT:  Head: Normocephalic and atraumatic.  Cardiovascular: Normal rate, regular rhythm and normal heart sounds.   Pulmonary/Chest: Effort normal and breath sounds normal.  Neurological: She is alert and oriented to person, place, and time.  Skin: Skin is dry.  Psychiatric: She has a normal mood and affect. Her behavior is normal.          Assessment & Plan:  HTN- refilled for 6 months. Looks great on second recheck.   Morbid obesity- started phentermine. Discussed side effects. Encouraged 1500mg  calorie diet and regular exercise. Continue bootcamp classes. Follow up in 1 month nurse visit.

## 2015-08-28 NOTE — Patient Instructions (Signed)
Vitamin D 2000 units every day.

## 2015-09-28 ENCOUNTER — Ambulatory Visit (INDEPENDENT_AMBULATORY_CARE_PROVIDER_SITE_OTHER): Payer: Managed Care, Other (non HMO) | Admitting: Physician Assistant

## 2015-09-28 VITALS — BP 135/83 | HR 84 | Wt 254.0 lb

## 2015-09-28 DIAGNOSIS — R635 Abnormal weight gain: Secondary | ICD-10-CM | POA: Diagnosis not present

## 2015-09-28 MED ORDER — PHENTERMINE HCL 37.5 MG PO TABS: 37.5000 mg | ORAL_TABLET | Freq: Every day | ORAL | Status: DC

## 2015-09-28 NOTE — Progress Notes (Signed)
Patient is here for blood pressure and weight check. Denies any trouble sleeping, palpitations, or any other medication problems. Patient has lost weight. A refill for Phentermine will be sent to patient preferred pharmacy. Patient advised to schedule a four week nurse visit and keep her upcoming appointment with her PCP. Verbalized understanding, no further questions. 

## 2015-10-28 ENCOUNTER — Ambulatory Visit (INDEPENDENT_AMBULATORY_CARE_PROVIDER_SITE_OTHER): Payer: Managed Care, Other (non HMO) | Admitting: Physician Assistant

## 2015-10-28 MED ORDER — PHENTERMINE HCL 37.5 MG PO TABS: 37.5000 mg | ORAL_TABLET | Freq: Every day | ORAL | Status: DC

## 2015-10-28 NOTE — Progress Notes (Signed)
Pt advised, she will schedule follow up with PCP.

## 2015-10-28 NOTE — Progress Notes (Signed)
Patient is here for blood pressure and weight check. Denies any trouble sleeping, palpitations, SOB, or any other medication problems. Patient has not lost weight. A refill for Phentermine will be sent to Provider for review. Patient advised to schedule a four week nurse visit if new Rx is sent in and keep her upcoming appointment with her PCP. Verbalized understanding, no further questions.

## 2015-11-25 ENCOUNTER — Ambulatory Visit (INDEPENDENT_AMBULATORY_CARE_PROVIDER_SITE_OTHER): Payer: Managed Care, Other (non HMO) | Admitting: Physician Assistant

## 2015-11-25 ENCOUNTER — Encounter: Payer: Self-pay | Admitting: Physician Assistant

## 2015-11-25 MED ORDER — NALTREXONE-BUPROPION HCL ER 8-90 MG PO TB12: ORAL_TABLET | ORAL | Status: DC

## 2015-11-25 NOTE — Patient Instructions (Addendum)
Nutrimost off [address].   Saxenda injectable to consider.

## 2015-11-25 NOTE — Progress Notes (Signed)
   Subjective:    Patient ID: Desiree Lawson, female    DOB: Nov 01, 1970, 45 y.o.   MRN: [id number]  HPI  Patient is a 45 year old female who presents to the clinic to come in to discuss weight. She's been on phentermine for the past few months and lost from 275-256. Since April she has maintained the same white. She is not counting her calories but feels like she is eating a balanced diet. She is eating a and [place] bacon for breakfast having snacks of almond milk and Cheerios or [place] yogurt. She uses a lot of 100-calorie packs. She stays away from fried food. She does not drink soda. She is exercising approximately 4 times a week at least 30-45 minutes. belviq ordered previously but never took due to cost.    Review of Systems  All other systems reviewed and are negative.      Objective:   Physical Exam  Constitutional: She is oriented to person, place, and time. She appears well-developed and well-nourished.  Morbid obesity.  Cardiovascular: Normal rate, regular rhythm and normal heart sounds.   Neurological: She is alert and oriented to person, place, and time.  Psychiatric: She has a normal mood and affect. Her behavior is normal.          Assessment & Plan:  Morbid obesity- BMI is still above 40. Patient has not lost weight on phentermine in the past 2 months. We'll stop phentermine at this time. Discuss other options for weight loss. We will try contrave. Discussed side effects. Given coupon card, discussed signing up for scale program. Discussed switching up exercise for better effect. Follow up in 2 months. Will consider prescribing separately if too expensive.   Pt can also consider saxenda. Nutrimost in [place] is also a diet program for weight loss.

## 2016-05-31 ENCOUNTER — Other Ambulatory Visit: Payer: Self-pay | Admitting: Physician Assistant

## 2016-05-31 DIAGNOSIS — Z1231 Encounter for screening mammogram for malignant neoplasm of breast: Secondary | ICD-10-CM

## 2016-06-03 ENCOUNTER — Ambulatory Visit (HOSPITAL_BASED_OUTPATIENT_CLINIC_OR_DEPARTMENT_OTHER)
Admission: RE | Admit: 2016-06-03 | Discharge: 2016-06-03 | Disposition: A | Discharge status: Home / Self Care | Payer: Managed Care, Other (non HMO) | Source: Ambulatory Visit | Attending: Physician Assistant | Admitting: Physician Assistant

## 2016-06-03 DIAGNOSIS — Z1231 Encounter for screening mammogram for malignant neoplasm of breast: Secondary | ICD-10-CM | POA: Diagnosis not present

## 2016-06-08 NOTE — Progress Notes (Signed)
Call pt: mammogram normal. Follow up in 1 year.

## 2016-11-17 LAB — BASIC METABOLIC PANEL: Glucose: 84

## 2016-11-17 LAB — LIPID PANEL
Cholesterol: 160 (ref 0–200)
HDL: 56 (ref 35–70)
LDL CALC: 90
Triglycerides: 56 (ref 40–160)

## 2016-11-29 ENCOUNTER — Ambulatory Visit (INDEPENDENT_AMBULATORY_CARE_PROVIDER_SITE_OTHER): Payer: Managed Care, Other (non HMO) | Admitting: Physician Assistant

## 2016-11-29 ENCOUNTER — Encounter: Payer: Self-pay | Admitting: Physician Assistant

## 2016-11-29 VITALS — BP 126/82 | HR 88 | Ht 66.0 in | Wt 261.0 lb

## 2016-11-29 DIAGNOSIS — Z0000 Encounter for general adult medical examination without abnormal findings: Secondary | ICD-10-CM

## 2016-11-29 DIAGNOSIS — I10 Essential (primary) hypertension: Secondary | ICD-10-CM

## 2016-11-29 MED ORDER — LIRAGLUTIDE -WEIGHT MANAGEMENT 18 MG/3ML ~~LOC~~ SOPN: 0.6000 mg | PEN_INJECTOR | Freq: Every day | SUBCUTANEOUS | 1 refills | Status: DC

## 2016-11-29 MED ORDER — LOSARTAN POTASSIUM-HCTZ 100-25 MG PO TABS: 1.0000 | ORAL_TABLET | Freq: Every day | ORAL | 1 refills | Status: DC

## 2016-11-29 MED ORDER — AMLODIPINE BESYLATE 5 MG PO TABS: 5.0000 mg | ORAL_TABLET | Freq: Every day | ORAL | 1 refills | Status: DC

## 2016-11-29 NOTE — Progress Notes (Signed)
Subjective:     Desiree Lawson is a 46 y.o. female and is here for a comprehensive physical exam. The patient reports problems - she continues to struggle to lose weight despite exercise and making diet changes for the last year. she did a round of phentermine about 1 year ago and has gained 6lbs back since then. she does burn bootcamps and has fit bit that tracks calories. .  Social History   Social History  . Marital status: Married    Spouse name: N/A  . Number of children: N/A  . Years of education: N/A   Occupational History  . Not on file.   Social History Main Topics  . Smoking status: Never Smoker  . Smokeless tobacco: Never Used  . Alcohol use Yes  . Drug use: No  . Sexual activity: Yes    Birth control/ protection: Pill   Other Topics Concern  . Not on file   Social History Narrative  . No narrative on file   Health Maintenance  Topic Date Due  . HIV Screening  11/29/2017 (Originally 02/05/1988)  . INFLUENZA VACCINE  02/26/2018 (Originally 01/11/2017)  . MAMMOGRAM  06/03/2017  . PAP SMEAR  10/01/2018  . TETANUS/TDAP  09/12/2021    The following portions of the patient's history were reviewed and updated as appropriate: allergies, current medications, past family history, past medical history, past social history, past surgical history and problem list.  Review of Systems Pertinent items noted in HPI and remainder of comprehensive ROS otherwise negative.   Objective:    BP 126/82   Pulse 88   Ht 5\' 6"  (1.676 m)   Wt 261 lb (118.4 kg)   BMI 42.13 kg/m  General appearance: alert, cooperative, appears stated age and morbidly obese Head: Normocephalic, without obvious abnormality, atraumatic Eyes: conjunctivae/corneas clear. PERRL, EOM's intact. Fundi benign. Ears: normal TM's and external ear canals both ears Nose: Nares normal. Septum midline. Mucosa normal. No drainage or sinus tenderness. Throat: lips, mucosa, and tongue normal; teeth and gums  normal Neck: no adenopathy, no carotid bruit, no JVD, supple, symmetrical, trachea midline and thyroid not enlarged, symmetric, no tenderness/mass/nodules Back: symmetric, no curvature. ROM normal. No CVA tenderness. Lungs: clear to auscultation bilaterally Heart: regular rate and rhythm, S1, S2 normal, no murmur, click, rub or gallop Abdomen: soft, non-tender; bowel sounds normal; no masses,  no organomegaly Extremities: extremities normal, atraumatic, no cyanosis or edema Pulses: 2+ and symmetric Skin: Skin color, texture, turgor normal. No rashes or lesions Lymph nodes: Cervical, supraclavicular, and axillary nodes normal. Neurologic: Alert and oriented X 3, normal strength and tone. Normal symmetric reflexes. Normal coordination and gait    Assessment:    Healthy female exam.      Plan:    Marland KitchenMarland KitchenFathima was seen today for annual exam.  Diagnoses and all orders for this visit:  Routine physical examination  Morbid obesity due to excess calories (HCC) -     Liraglutide -Weight Management (SAXENDA) 18 MG/3ML SOPN; Inject 0.6 mg into the skin daily. For one week then increase by .6mg  weekly until reaches 3mg  daily.  Please include ultra fine needles 33mm  Hypertension, essential, benign -     amLODipine (NORVASC) 5 MG tablet; Take 1 tablet (5 mg total) by mouth daily. -     losartan-hydrochlorothiazide (HYZAAR) 100-25 MG tablet; Take 1 tablet by mouth daily.   Mammogram and pap up to date.  Vaccines up to date.  BP controlled today. Refills given for 6 months.  Comes in with fasting labs done at work. They look great. Will scan into system. Cholesterol and glucose great.   Discussed weight. Will try saxenda. Coupon card given. Discussed side effects. Follow up in 2 months. She is doing great with exercise. Try to pay closer attention to diet over the next 2 months. Goal calories 1500.  See After Visit Summary for Counseling Recommendations

## 2016-11-30 ENCOUNTER — Encounter: Payer: Managed Care, Other (non HMO) | Admitting: Physician Assistant

## 2016-12-07 ENCOUNTER — Encounter: Payer: Self-pay | Admitting: Physician Assistant

## 2016-12-13 ENCOUNTER — Telehealth: Payer: Self-pay

## 2016-12-13 ENCOUNTER — Encounter: Payer: Self-pay | Admitting: Physician Assistant

## 2016-12-13 NOTE — Telephone Encounter (Signed)
PA for saxenda approved through 04-04-17. -EH/RMA

## 2017-02-01 ENCOUNTER — Ambulatory Visit: Payer: Managed Care, Other (non HMO) | Admitting: Physician Assistant

## 2017-06-05 ENCOUNTER — Encounter: Payer: Self-pay | Admitting: Physician Assistant

## 2017-12-22 ENCOUNTER — Ambulatory Visit (INDEPENDENT_AMBULATORY_CARE_PROVIDER_SITE_OTHER): Payer: 59

## 2017-12-22 ENCOUNTER — Encounter: Payer: Self-pay | Admitting: Physician Assistant

## 2017-12-22 ENCOUNTER — Ambulatory Visit (INDEPENDENT_AMBULATORY_CARE_PROVIDER_SITE_OTHER): Payer: 59 | Admitting: Physician Assistant

## 2017-12-22 VITALS — BP 176/90 | HR 85 | Ht 65.98 in | Wt 270.0 lb

## 2017-12-22 DIAGNOSIS — R030 Elevated blood-pressure reading, without diagnosis of hypertension: Secondary | ICD-10-CM | POA: Diagnosis not present

## 2017-12-22 DIAGNOSIS — Z3009 Encounter for other general counseling and advice on contraception: Secondary | ICD-10-CM | POA: Diagnosis not present

## 2017-12-22 DIAGNOSIS — Z13220 Encounter for screening for lipoid disorders: Secondary | ICD-10-CM

## 2017-12-22 DIAGNOSIS — Z1231 Encounter for screening mammogram for malignant neoplasm of breast: Secondary | ICD-10-CM

## 2017-12-22 DIAGNOSIS — Z131 Encounter for screening for diabetes mellitus: Secondary | ICD-10-CM

## 2017-12-22 DIAGNOSIS — Z0000 Encounter for general adult medical examination without abnormal findings: Secondary | ICD-10-CM

## 2017-12-22 DIAGNOSIS — I10 Essential (primary) hypertension: Secondary | ICD-10-CM | POA: Diagnosis not present

## 2017-12-22 NOTE — Progress Notes (Signed)
Call pt: normal mammogram follow up in 1 year.

## 2017-12-22 NOTE — Patient Instructions (Signed)

## 2017-12-22 NOTE — Progress Notes (Signed)
Subjective:     Desiree Lawson is a 47 y.o. female and is here for a comprehensive physical exam. The patient reports problems - she would like to have a tubal ligation and request a referral.   Social History   Socioeconomic History  . Marital status: Married    Spouse name: Not on file  . Number of children: Not on file  . Years of education: Not on file  . Highest education level: Not on file  Occupational History  . Not on file  Social Needs  . Financial resource strain: Not on file  . Food insecurity:    Worry: Not on file    Inability: Not on file  . Transportation needs:    Medical: Not on file    Non-medical: Not on file  Tobacco Use  . Smoking status: Never Smoker  . Smokeless tobacco: Never Used  Substance and Sexual Activity  . Alcohol use: Yes  . Drug use: No  . Sexual activity: Yes    Birth control/protection: Pill  Lifestyle  . Physical activity:    Days per week: Not on file    Minutes per session: Not on file  . Stress: Not on file  Relationships  . Social connections:    Talks on phone: Not on file    Gets together: Not on file    Attends religious service: Not on file    Active member of club or organization: Not on file    Attends meetings of clubs or organizations: Not on file    Relationship status: Not on file  . Intimate partner violence:    Fear of current or ex partner: Not on file    Emotionally abused: Not on file    Physically abused: Not on file    Forced sexual activity: Not on file  Other Topics Concern  . Not on file  Social History Narrative  . Not on file   Health Maintenance  Topic Date Due  . INFLUENZA VACCINE  02/26/2018 (Originally 01/11/2018)  . HIV Screening  12/23/2018 (Originally 02/[birth year]/1988)  . PAP SMEAR  10/01/2018  . MAMMOGRAM  12/23/2018  . TETANUS/TDAP  09/12/2021    The following portions of the patient's history were reviewed and updated as appropriate: allergies, current medications, past family  history, past medical history, past social history, past surgical history and problem list.  Review of Systems Pertinent items noted in HPI and remainder of comprehensive ROS otherwise negative.   Objective:    BP (!) 176/90   Pulse 85   Ht 5' 5.98" (1.676 m)   Wt 270 lb (122.5 kg)   SpO2 100%   BMI 43.60 kg/m  General appearance: alert, cooperative, appears stated age and morbidly obese Head: Normocephalic, without obvious abnormality, atraumatic Eyes: conjunctivae/corneas clear. PERRL, EOM's intact. Fundi benign. Ears: normal TM's and external ear canals both ears Nose: Nares normal. Septum midline. Mucosa normal. No drainage or sinus tenderness. Throat: lips, mucosa, and tongue normal; teeth and gums normal Neck: no adenopathy, no carotid bruit, no JVD, supple, symmetrical, trachea midline and thyroid not enlarged, symmetric, no tenderness/mass/nodules Back: symmetric, no curvature. ROM normal. No CVA tenderness. Lungs: clear to auscultation bilaterally Heart: regular rate and rhythm, S1, S2 normal, no murmur, click, rub or gallop Abdomen: soft, non-tender; bowel sounds normal; no masses,  no organomegaly Extremities: extremities normal, atraumatic, no cyanosis or edema Pulses: 2+ and symmetric Skin: Skin color, texture, turgor normal. No rashes or lesions Lymph nodes: Cervical, supraclavicular,  and axillary nodes normal. Neurologic: Alert and oriented X 3, normal strength and tone. Normal symmetric reflexes. Normal coordination and gait    Assessment:    Healthy female exam.      Plan:    Marland KitchenMarland KitchenJeremiah was seen today for annual exam.  Diagnoses and all orders for this visit:  Routine physical examination -     B12 and Folate Panel -     VITAMIN D 25 Hydroxy (Vit-D Deficiency, Fractures) -     COMPLETE METABOLIC PANEL WITH GFR -     Lipid Panel w/reflex Direct LDL -     TSH -     CBC with Differential/Platelet -     MM 3D SCREEN BREAST BILATERAL  Screening for lipid  disorders -     Lipid Panel w/reflex Direct LDL  Screening for diabetes mellitus -     COMPLETE METABOLIC PANEL WITH GFR  Visit for screening mammogram -     MM 3D SCREEN BREAST BILATERAL  Family planning -     Ambulatory referral to Gynecology  Elevated blood pressure reading  Hypertension, essential, benign  Morbid obesity due to excess calories ([place])   .[name] Depression screen [facility] 2/9 12/22/2017 11/29/2016  Decreased Interest 0 1  Down, Depressed, Hopeless 0 1  PHQ - 2 Score 0 2  Altered sleeping 0 -  Tired, decreased energy 1 -  Change in appetite 0 -  Feeling bad or failure about yourself  0 -  Trouble concentrating 0 -  Moving slowly or fidgety/restless 0 -  Suicidal thoughts 0 -  PHQ-9 Score 1 -  Difficult doing work/chores Not difficult at all -   .. Discussed 150 minutes of exercise a week.  Encouraged vitamin D 1000 units and Calcium 1300mg  or 4 servings of dairy a day.  Vaccines up to date.  Pap done 09/2013 request 5 year with cotesting. Pt declines STI testing.  Referral made for BTL.  Mammogram ordered.  Fasting labs ordered.   BP not controlled. She admits she has not taken medication in a few days due to being out. Restart and come in next week for BP recheck.   Discussed weight and exercise. Pt is exercising regularly. Not interested in bariatric surgery. Tried belviq/contrave/phentermine with little success. saxenda was too expensive.  See After Visit Summary for Counseling Recommendations

## 2017-12-24 ENCOUNTER — Encounter: Payer: Self-pay | Admitting: Physician Assistant

## 2017-12-26 LAB — COMPLETE METABOLIC PANEL WITH GFR
AG Ratio: 1.2 (calc) (ref 1.0–2.5)
ALT: 14 U/L (ref 6–29)
AST: 42 U/L — ABNORMAL HIGH (ref 10–30)
Albumin: 4.2 g/dL (ref 3.6–5.1)
Alkaline phosphatase (APISO): 63 U/L (ref 33–115)
BUN: 15 mg/dL (ref 7–25)
CO2: 28 mmol/L (ref 20–32)
Calcium: 9.7 mg/dL (ref 8.6–10.2)
Chloride: 103 mmol/L (ref 98–110)
Creat: 0.99 mg/dL (ref 0.50–1.10)
GFR, Est African American: 80 mL/min/{1.73_m2} (ref >=60)
GFR, Est Non African American: 69 mL/min/{1.73_m2} (ref >=60)
Globulin: 3.4 g/dL (calc) (ref 1.9–3.7)
Glucose, Bld: 103 mg/dL — ABNORMAL HIGH (ref 65–99)
Potassium: 4.5 mmol/L (ref 3.5–5.3)
Sodium: 138 mmol/L (ref 135–146)
Total Bilirubin: 0.3 mg/dL (ref 0.2–1.2)
Total Protein: 7.6 g/dL (ref 6.1–8.1)

## 2017-12-26 LAB — CBC WITH DIFFERENTIAL/PLATELET
Basophils Absolute: 20 cells/uL (ref 0–200)
Basophils Relative: 0.4 %
Eosinophils Absolute: 69 cells/uL (ref 15–500)
Eosinophils Relative: 1.4 %
HCT: 39.3 % (ref 35.0–45.0)
Hemoglobin: 13.1 g/dL (ref 11.7–15.5)
Lymphs Abs: 2254 cells/uL (ref 850–3900)
MCH: 29.2 pg (ref 27.0–33.0)
MCHC: 33.3 g/dL (ref 32.0–36.0)
MCV: 87.5 fL (ref 80.0–100.0)
MPV: 9.8 fL (ref 7.5–12.5)
Monocytes Relative: 11.5 %
Neutro Abs: 1994 cells/uL (ref 1500–7800)
Neutrophils Relative %: 40.7 %
Platelets: 340 10*3/uL (ref 140–400)
RBC: 4.49 10*6/uL (ref 3.80–5.10)
RDW: 13.1 % (ref 11.0–15.0)
Total Lymphocyte: 46 %
WBC mixed population: 564 cells/uL (ref 200–950)
WBC: 4.9 10*3/uL (ref 3.8–10.8)

## 2017-12-26 LAB — LIPID PANEL W/REFLEX DIRECT LDL
CHOLESTEROL: 158 mg/dL (ref <200)
HDL: 54 mg/dL (ref >50)
LDL Cholesterol (Calc): 90 mg/dL (calc)
NON-HDL CHOLESTEROL (CALC): 104 mg/dL (ref <130)
TRIGLYCERIDES: 55 mg/dL (ref <150)
Total CHOL/HDL Ratio: 2.9 (calc) (ref <5.0)

## 2017-12-26 LAB — TSH: TSH: 2.64 mIU/L

## 2017-12-26 LAB — VITAMIN D 25 HYDROXY (VIT D DEFICIENCY, FRACTURES): Vit D, 25-Hydroxy: 14 ng/mL — ABNORMAL LOW (ref 30–100)

## 2017-12-26 LAB — B12 AND FOLATE PANEL
Folate: 10.6 ng/mL
Vitamin B-12: 448 pg/mL (ref 200–1100)

## 2017-12-26 MED ORDER — ERGOCALCIFEROL 1.25 MG (50000 UT) PO CAPS: 50000.0000 [IU] | ORAL_CAPSULE | ORAL | 0 refills | Status: DC

## 2017-12-26 NOTE — Addendum Note (Signed)
Addended by: Donella Stade on: 12/26/2017 07:35 AM   Modules accepted: Orders

## 2017-12-26 NOTE — Progress Notes (Signed)
Call pt: b12 normal range but down from 2 years ago. I would stay on a b12 supplement.  Vitamin d is low. Need weekly high dose and recheck in 3 months.  One liver enzyme just a hair elevated but down from where you were in the past. Not concerning.  Cholesterol fantastic.  Not anemic.

## 2018-01-22 ENCOUNTER — Ambulatory Visit: Payer: 59

## 2018-01-24 ENCOUNTER — Encounter: Payer: Self-pay | Admitting: Obstetrics & Gynecology

## 2018-01-24 ENCOUNTER — Ambulatory Visit (INDEPENDENT_AMBULATORY_CARE_PROVIDER_SITE_OTHER): Payer: 59 | Admitting: Physician Assistant

## 2018-01-24 ENCOUNTER — Encounter (HOSPITAL_COMMUNITY): Payer: Self-pay

## 2018-01-24 ENCOUNTER — Ambulatory Visit (INDEPENDENT_AMBULATORY_CARE_PROVIDER_SITE_OTHER): Payer: 59 | Admitting: Obstetrics & Gynecology

## 2018-01-24 VITALS — BP 144/87 | HR 78

## 2018-01-24 VITALS — BP 139/87 | HR 80 | Resp 16 | Ht 67.0 in | Wt 271.0 lb

## 2018-01-24 DIAGNOSIS — I10 Essential (primary) hypertension: Secondary | ICD-10-CM | POA: Diagnosis not present

## 2018-01-24 DIAGNOSIS — Z3009 Encounter for other general counseling and advice on contraception: Secondary | ICD-10-CM

## 2018-01-24 MED ORDER — AMLODIPINE BESYLATE 10 MG PO TABS: 10.0000 mg | ORAL_TABLET | Freq: Every day | ORAL | 3 refills | Status: DC

## 2018-01-24 NOTE — Progress Notes (Signed)
Pt came into clinic today for BP check. She was seen at [facility] in our building prior to appt with [place] this morning and her BP was 139/87. Pt reports taking her BP Rx's every morning, took them around 0730 this morning. Reports no negative side effects. Denies any chest pain, dizziness, SOB. Pt's BP here was elevated at first check. Did let her sit prior to recheck. Pt's BP was still elevated. Per PCP, pt to increase to Amlodipine 10mg  daily. New Rx sent. Pt will follow up in 2 weeks for [place] BP check.   Vitals:   01/24/18 0842 01/24/18 0853  BP: (!) 152/87 (!) 144/87  Pulse: 82 78   Agree with above plan. [place] Planas PA-C

## 2018-01-24 NOTE — Progress Notes (Signed)
   Subjective:    Patient ID: Desiree Lawson, female    DOB: [birth year], 47 y.o.   MRN: [id number]  HPI  47 yo married P74 (19 yo son, 1 grandson and a stepdaughter 27 yo with a daughter) here today for a discussion regarding her desire for permanent sterility. She currently uses condoms, has had to use Plan B a couple of times.  Review of Systems Works for [employer] for 19 years Married for 10 years Denies dyspareunia Periods last 3-5 days, monthly    Objective:   Physical Exam Breathing, conversing, and ambulating normally Well nourished, well hydrated Black female, no apparent distress    Assessment & Plan:  Desire for permanent sterility and also for decrease risk of ovarian cancer. She is 100% sure she doesn't want more kids. I sent Drema Balzarine an email to schedule lap BS.

## 2018-01-26 ENCOUNTER — Encounter (HOSPITAL_COMMUNITY): Payer: Self-pay

## 2018-02-09 ENCOUNTER — Ambulatory Visit: Payer: 59

## 2018-02-16 ENCOUNTER — Ambulatory Visit: Payer: 59

## 2018-03-01 NOTE — Patient Instructions (Addendum)
Your procedure is scheduled on: Wednesday, 10/2  Enter through the Main Entrance of [facility] at: 10:45 am  Pick up the phone at the desk and dial [phone].  Call this number if you have problems the morning of surgery: [phone].  Remember: Do NOT eat food or Do NOT drink clear liquids (including water) after midnight Tuesday.  Take these medicines the morning of surgery with a SIP OF WATER:  None  Brush your teeth on the day of surgery.  Stop herbal medications, vitamin supplements, Ibuprofen/NSAIDS 1 week prior to surgery - Thursday 03/08/18.  Do NOT wear jewelry (body piercing), metal hair clips/bobby pins, make-up, or nail polish. Do NOT wear lotions, powders, or perfumes.  You may wear deoderant. Do NOT shave for 48 hours prior to surgery. Do NOT bring valuables to the hospital.   Have a responsible adult drive you home and stay with you for 24 hours after your procedure.  Home with Husband Desiree Lawson cell [phone].

## 2018-03-05 ENCOUNTER — Other Ambulatory Visit: Payer: Self-pay

## 2018-03-05 ENCOUNTER — Encounter (HOSPITAL_COMMUNITY): Payer: Self-pay

## 2018-03-05 ENCOUNTER — Encounter (HOSPITAL_COMMUNITY)
Admission: RE | Admit: 2018-03-05 | Discharge: 2018-03-05 | Disposition: A | Discharge status: Home / Self Care | Payer: 59 | Source: Ambulatory Visit | Attending: Obstetrics & Gynecology | Admitting: Obstetrics & Gynecology

## 2018-03-05 DIAGNOSIS — Z01818 Encounter for other preprocedural examination: Secondary | ICD-10-CM | POA: Diagnosis present

## 2018-03-05 HISTORY — DX: Other seasonal allergic rhinitis: J30.2

## 2018-03-05 HISTORY — DX: Encounter for elective termination of pregnancy: Z33.2

## 2018-03-05 LAB — BASIC METABOLIC PANEL
Anion gap: 9 (ref 5–15)
BUN: 13 mg/dL (ref 6–20)
CALCIUM: 9.3 mg/dL (ref 8.9–10.3)
CO2: 27 mmol/L (ref 22–32)
CREATININE: 0.99 mg/dL (ref 0.44–1.00)
Chloride: 100 mmol/L (ref 98–111)
GFR calc Af Amer: >60 mL/min (ref >60)
GFR calc non Af Amer: >60 mL/min (ref >60)
GLUCOSE: 88 mg/dL (ref 70–99)
Potassium: 3.6 mmol/L (ref 3.5–5.1)
Sodium: 136 mmol/L (ref 135–145)

## 2018-03-05 LAB — CBC
HCT: 42.2 % (ref 36.0–46.0)
Hemoglobin: 13.9 g/dL (ref 12.0–15.0)
MCH: 29.9 pg (ref 26.0–34.0)
MCHC: 32.9 g/dL (ref 30.0–36.0)
MCV: 90.8 fL (ref 78.0–100.0)
Platelets: 360 10*3/uL (ref 150–400)
RBC: 4.65 MIL/uL (ref 3.87–5.11)
RDW: 14.4 % (ref 11.5–15.5)
WBC: 4.9 10*3/uL (ref 4.0–10.5)

## 2018-03-14 ENCOUNTER — Encounter (HOSPITAL_COMMUNITY)
Admission: AD | Disposition: A | Discharge status: Home / Self Care | Payer: Self-pay | Source: Ambulatory Visit | Attending: Obstetrics & Gynecology

## 2018-03-14 ENCOUNTER — Ambulatory Visit (HOSPITAL_COMMUNITY)
Admission: AD | Admit: 2018-03-14 | Discharge: 2018-03-14 | Disposition: A | Discharge status: Home / Self Care | Payer: 59 | Source: Ambulatory Visit | Attending: Obstetrics & Gynecology | Admitting: Obstetrics & Gynecology

## 2018-03-14 ENCOUNTER — Ambulatory Visit (HOSPITAL_COMMUNITY): Payer: 59 | Admitting: Certified Registered Nurse Anesthetist

## 2018-03-14 ENCOUNTER — Other Ambulatory Visit: Payer: Self-pay

## 2018-03-14 ENCOUNTER — Encounter (HOSPITAL_COMMUNITY): Payer: Self-pay | Admitting: Certified Registered Nurse Anesthetist

## 2018-03-14 DIAGNOSIS — I10 Essential (primary) hypertension: Secondary | ICD-10-CM | POA: Insufficient documentation

## 2018-03-14 DIAGNOSIS — Z6841 Body Mass Index (BMI) 40.0 and over, adult: Secondary | ICD-10-CM | POA: Insufficient documentation

## 2018-03-14 DIAGNOSIS — Z302 Encounter for sterilization: Secondary | ICD-10-CM | POA: Diagnosis not present

## 2018-03-14 HISTORY — PX: LAPAROSCOPIC BILATERAL SALPINGECTOMY: SHX5889

## 2018-03-14 HISTORY — DX: Nausea with vomiting, unspecified: Z98.890

## 2018-03-14 HISTORY — DX: Other specified postprocedural states: R11.2

## 2018-03-14 LAB — PREGNANCY, URINE: Preg Test, Ur: NEGATIVE

## 2018-03-14 SURGERY — SALPINGECTOMY, BILATERAL, LAPAROSCOPIC
Anesthesia: General | Laterality: Bilateral

## 2018-03-14 MED ORDER — PROPOFOL 10 MG/ML IV BOLUS
INTRAVENOUS | Status: AC
  Filled 2018-03-14: qty 20

## 2018-03-14 MED ORDER — MIDAZOLAM HCL 2 MG/2ML IJ SOLN
INTRAMUSCULAR | Status: AC
  Filled 2018-03-14: qty 2

## 2018-03-14 MED ORDER — SUGAMMADEX SODIUM 200 MG/2ML IV SOLN
INTRAVENOUS | Status: DC | PRN
  Administered 2018-03-14: 200 mg via INTRAVENOUS

## 2018-03-14 MED ORDER — DEXAMETHASONE SODIUM PHOSPHATE 4 MG/ML IJ SOLN
INTRAMUSCULAR | Status: AC
  Filled 2018-03-14: qty 1

## 2018-03-14 MED ORDER — KETOROLAC TROMETHAMINE 30 MG/ML IJ SOLN
INTRAMUSCULAR | Status: AC
  Filled 2018-03-14: qty 1

## 2018-03-14 MED ORDER — ROCURONIUM BROMIDE 100 MG/10ML IV SOLN
INTRAVENOUS | Status: AC
  Filled 2018-03-14: qty 1

## 2018-03-14 MED ORDER — LIDOCAINE HCL (CARDIAC) PF 100 MG/5ML IV SOSY
PREFILLED_SYRINGE | INTRAVENOUS | Status: DC | PRN
  Administered 2018-03-14: 50 mg via INTRAVENOUS

## 2018-03-14 MED ORDER — DEXAMETHASONE SODIUM PHOSPHATE 10 MG/ML IJ SOLN
INTRAMUSCULAR | Status: DC | PRN
  Administered 2018-03-14: 4 mg via INTRAVENOUS

## 2018-03-14 MED ORDER — GLYCOPYRROLATE 0.2 MG/ML IJ SOLN
INTRAMUSCULAR | Status: AC
  Filled 2018-03-14: qty 1

## 2018-03-14 MED ORDER — BUPIVACAINE HCL (PF) 0.5 % IJ SOLN
INTRAMUSCULAR | Status: AC
  Filled 2018-03-14: qty 30

## 2018-03-14 MED ORDER — SCOPOLAMINE 1 MG/3DAYS TD PT72
MEDICATED_PATCH | TRANSDERMAL | Status: AC
  Administered 2018-03-14: 1.5 mg via TRANSDERMAL
  Filled 2018-03-14: qty 1

## 2018-03-14 MED ORDER — LIDOCAINE HCL (CARDIAC) PF 100 MG/5ML IV SOSY
PREFILLED_SYRINGE | INTRAVENOUS | Status: AC
  Filled 2018-03-14: qty 5

## 2018-03-14 MED ORDER — SCOPOLAMINE 1 MG/3DAYS TD PT72
1.0000 | MEDICATED_PATCH | Freq: Once | TRANSDERMAL | Status: DC
  Administered 2018-03-14: 1.5 mg via TRANSDERMAL

## 2018-03-14 MED ORDER — LACTATED RINGERS IV SOLN
INTRAVENOUS | Status: DC
  Administered 2018-03-14: 125 mL/h via INTRAVENOUS
  Administered 2018-03-14: 13:00:00 via INTRAVENOUS

## 2018-03-14 MED ORDER — LIDOCAINE HCL (PF) 1 % IJ SOLN
INTRAMUSCULAR | Status: AC
  Filled 2018-03-14: qty 5

## 2018-03-14 MED ORDER — BUPIVACAINE HCL (PF) 0.5 % IJ SOLN
INTRAMUSCULAR | Status: DC | PRN
  Administered 2018-03-14: 30 mL

## 2018-03-14 MED ORDER — ONDANSETRON HCL 4 MG/2ML IJ SOLN
INTRAMUSCULAR | Status: DC | PRN
  Administered 2018-03-14: 4 mg via INTRAVENOUS

## 2018-03-14 MED ORDER — ONDANSETRON HCL 4 MG/2ML IJ SOLN
INTRAMUSCULAR | Status: AC
  Filled 2018-03-14: qty 2

## 2018-03-14 MED ORDER — MIDAZOLAM HCL 2 MG/2ML IJ SOLN
INTRAMUSCULAR | Status: DC | PRN
  Administered 2018-03-14: 2 mg via INTRAVENOUS

## 2018-03-14 MED ORDER — PROPOFOL 10 MG/ML IV BOLUS
INTRAVENOUS | Status: DC | PRN
  Administered 2018-03-14: 160 mg via INTRAVENOUS

## 2018-03-14 MED ORDER — FENTANYL CITRATE (PF) 250 MCG/5ML IJ SOLN
INTRAMUSCULAR | Status: AC
  Filled 2018-03-14: qty 5

## 2018-03-14 MED ORDER — IBUPROFEN 600 MG PO TABS: 600.0000 mg | ORAL_TABLET | Freq: Four times a day (QID) | ORAL | 1 refills | Status: DC | PRN

## 2018-03-14 MED ORDER — SUGAMMADEX SODIUM 200 MG/2ML IV SOLN
INTRAVENOUS | Status: AC
  Filled 2018-03-14: qty 2

## 2018-03-14 MED ORDER — KETOROLAC TROMETHAMINE 30 MG/ML IJ SOLN
INTRAMUSCULAR | Status: DC | PRN
  Administered 2018-03-14: 30 mg via INTRAVENOUS

## 2018-03-14 MED ORDER — FENTANYL CITRATE (PF) 100 MCG/2ML IJ SOLN
INTRAMUSCULAR | Status: DC | PRN
  Administered 2018-03-14 (×5): 50 ug via INTRAVENOUS

## 2018-03-14 MED ORDER — ROCURONIUM BROMIDE 100 MG/10ML IV SOLN
INTRAVENOUS | Status: DC | PRN
  Administered 2018-03-14: 40 mg via INTRAVENOUS

## 2018-03-14 MED ORDER — GLYCOPYRROLATE 0.2 MG/ML IJ SOLN
INTRAMUSCULAR | Status: DC | PRN
  Administered 2018-03-14: 0.1 mg via INTRAVENOUS

## 2018-03-14 MED ORDER — OXYCODONE-ACETAMINOPHEN 5-325 MG PO TABS: 1.0000 | ORAL_TABLET | ORAL | 0 refills | Status: DC | PRN

## 2018-03-14 MED ORDER — PHENYLEPHRINE 40 MCG/ML (10ML) SYRINGE FOR IV PUSH (FOR BLOOD PRESSURE SUPPORT)
PREFILLED_SYRINGE | INTRAVENOUS | Status: AC
  Filled 2018-03-14: qty 10

## 2018-03-14 SURGICAL SUPPLY — 25 items
DRSG OPSITE POSTOP 3X4 (GAUZE/BANDAGES/DRESSINGS) ×2 IMPLANT
DURAPREP 26ML APPLICATOR (WOUND CARE) ×2 IMPLANT
GLOVE BIO SURGEON STRL SZ 6.5 (GLOVE) ×4 IMPLANT
GLOVE BIOGEL PI IND STRL 7.0 (GLOVE) ×2 IMPLANT
GLOVE BIOGEL PI INDICATOR 7.0 (GLOVE) ×2
GOWN STRL REUS W/TWL LRG LVL3 (GOWN DISPOSABLE) ×4 IMPLANT
NDL SAFETY ECLIPSE 18X1.5 (NEEDLE) ×1 IMPLANT
NEEDLE HYPO 18GX1.5 SHARP (NEEDLE) ×1
NEEDLE INSUFFLATION 120MM (ENDOMECHANICALS) ×2 IMPLANT
NS IRRIG 1000ML POUR BTL (IV SOLUTION) ×2 IMPLANT
PACK LAPAROSCOPY BASIN (CUSTOM PROCEDURE TRAY) ×2 IMPLANT
PACK TRENDGUARD 450 HYBRID PRO (MISCELLANEOUS) ×1 IMPLANT
PROTECTOR NERVE ULNAR (MISCELLANEOUS) ×4 IMPLANT
SHEARS HARMONIC ACE PLUS 36CM (ENDOMECHANICALS) ×2 IMPLANT
SLEEVE XCEL OPT CAN 5 100 (ENDOMECHANICALS) ×4 IMPLANT
SUT VICRYL 0 UR6 27IN ABS (SUTURE) ×2 IMPLANT
SUT VICRYL 4-0 PS2 18IN ABS (SUTURE) ×2 IMPLANT
SYR 30ML LL (SYRINGE) ×2 IMPLANT
SYSTEM CARTER THOMASON II (TROCAR) ×2 IMPLANT
TOWEL OR 17X24 6PK STRL BLUE (TOWEL DISPOSABLE) ×4 IMPLANT
TRENDGUARD 450 HYBRID PRO PACK (MISCELLANEOUS) ×2
TROCAR OPTI TIP 5M 100M (ENDOMECHANICALS) ×2 IMPLANT
TROCAR XCEL DIL TIP R 11M (ENDOMECHANICALS) ×2 IMPLANT
TUBING INSUF HEATED (TUBING) ×4 IMPLANT
WARMER LAPAROSCOPE (MISCELLANEOUS) ×2 IMPLANT

## 2018-03-14 NOTE — Anesthesia Preprocedure Evaluation (Signed)
Anesthesia Evaluation  Patient identified by MRN, date of birth, ID band Patient awake    Reviewed: Allergy & Precautions, NPO status , Patient's Chart, lab work & pertinent test results  History of Anesthesia Complications (+) PONV and history of anesthetic complications  Airway Mallampati: II  TM Distance: >3 FB Neck ROM: Full    Dental no notable dental hx. (+) Teeth Intact   Pulmonary neg pulmonary ROS,    Pulmonary exam normal breath sounds clear to auscultation       Cardiovascular hypertension, Pt. on medications Normal cardiovascular exam Rhythm:Regular Rate:Normal     Neuro/Psych negative neurological ROS  negative psych ROS   GI/Hepatic negative GI ROS, Neg liver ROS,   Endo/Other  Morbid obesity  Renal/GU negative Renal ROS  negative genitourinary   Musculoskeletal negative musculoskeletal ROS (+)   Abdominal (+) + obese,   Peds  Hematology negative hematology ROS (+)   Anesthesia Other Findings   Reproductive/Obstetrics                             Anesthesia Physical Anesthesia Plan  ASA: III  Anesthesia Plan: General   Post-op Pain Management:    Induction: Intravenous  PONV Risk Score and Plan: 4 or greater and Scopolamine patch - Pre-op, Midazolam, Dexamethasone, Ondansetron and Treatment may vary due to age or medical condition  Airway Management Planned: Oral ETT  Additional Equipment:   Intra-op Plan:   Post-operative Plan: Extubation in OR  Informed Consent: I have reviewed the patients History and Physical, chart, labs and discussed the procedure including the risks, benefits and alternatives for the proposed anesthesia with the patient or authorized representative who has indicated his/her understanding and acceptance.   Dental advisory given  Plan Discussed with: CRNA and Surgeon  Anesthesia Plan Comments:         Anesthesia Quick  Evaluation

## 2018-03-14 NOTE — Transfer of Care (Signed)
Immediate Anesthesia Transfer of Care Note  Patient: [name]  Procedure(s) Performed: LAPAROSCOPIC BILATERAL SALPINGECTOMY (Bilateral )  Patient Location: PACU  Anesthesia Type:General  Level of Consciousness: awake, alert  and oriented  Airway & Oxygen Therapy: Patient Spontanous Breathing and Patient connected to nasal cannula oxygen  Post-op Assessment: Report given to RN and Post -op Vital signs reviewed and stable  Post vital signs: Reviewed and stable  Last Vitals:  Vitals Value Taken Time  BP 151/84 03/14/2018  1:17 PM  Temp    Pulse 62 03/14/2018  1:20 PM  Resp 10 03/14/2018  1:20 PM  SpO2 100 % 03/14/2018  1:20 PM  Vitals shown include unvalidated device data.  Last Pain:  Vitals:   03/14/18 1100  TempSrc: Oral  PainSc: 0-No pain      Patients Stated Pain Goal: 3 (00/29/84 7308)  Complications: No apparent anesthesia complications

## 2018-03-14 NOTE — Discharge Instructions (Addendum)

## 2018-03-14 NOTE — H&P (Signed)
Desiree Lawson is an 47 y.o. female married P32 (40 yo son, 1 grandson and a stepdaughter 33 yo with a daughter) here today for a discussion regarding her desire for permanent sterility. She currently uses condoms, has had to use Plan B a couple of times. Her periods are monthly and last 3-5 days.     Menstrual History: Menarche age: 86 Patient's last menstrual period was 03/03/2018 (exact date).    Past Medical History:  Diagnosis Date  . Hypertension   . PONV (postoperative nausea and vomiting)   . Seasonal allergies   . SVD (spontaneous vaginal delivery)    x 1  . Termination of pregnancy (fetus)     Past Surgical History:  Procedure Laterality Date  . BREAST LUMPECTOMY  1992 LT/2005 RT  . CHOLECYSTECTOMY      Family History  Problem Relation Age of Onset  . Diabetes Mother   . Stroke Mother   . Hypertension Mother   . Hypertension Father     Social History:  reports that she has never smoked. She has never used smokeless tobacco. She reports that she drinks about 3.0 - 5.0 standard drinks of alcohol per week. She reports that she does not use drugs.  Allergies:  Allergies  Allergen Reactions  . Ramipril     Cough and muscle cramps    Medications Prior to Admission  Medication Sig Dispense Refill Last Dose  . amLODipine (NORVASC) 10 MG tablet Take 1 tablet (10 mg total) by mouth daily. (Patient taking differently: Take 10 mg by mouth every evening. ) 30 tablet 3 03/13/2018 at Unknown time  . losartan-hydrochlorothiazide (HYZAAR) 100-25 MG tablet Take 1 tablet by mouth daily. (Patient taking differently: Take 1 tablet by mouth every evening. ) 90 tablet 1 03/13/2018 at Unknown time  . ergocalciferol (VITAMIN D2) 50000 units capsule Take 1 capsule (50,000 Units total) by mouth once a week. (Patient not taking: Reported on 03/02/2018) 12 capsule 0 Not Taking at Unknown time    ROS  Works at [employer]  Blood pressure (!) 155/106, pulse 86, temperature 98.3 F (36.8  C), temperature source Oral, resp. rate 18, height 5\' 7"  (1.702 m), weight 122 kg, last menstrual period 03/03/2018, SpO2 100 %. Physical Exam  Heart- rrr Lungs- CTAB Abd- benign  Results for orders placed or performed during the hospital encounter of 03/14/18 (from the past 24 hour(s))  Pregnancy, urine     Status: None   Collection Time: 03/14/18 10:45 AM  Result Value Ref Range   Preg Test, Ur NEGATIVE NEGATIVE    No results found.  Assessment/Plan: Unwanted fertility- plan for laparoscopic removal of both oviducts  She understands the risks of surgery, including, but not to infection, bleeding, DVTs, damage to bowel, bladder, ureters. She wishes to proceed.     Emily Filbert 03/14/2018, 12:02 PM

## 2018-03-14 NOTE — Op Note (Signed)
03/14/2018  1:19 PM  PATIENT:  Desiree Lawson  47 y.o. female  PRE-OPERATIVE DIAGNOSIS:  Undesired Fertility  POST-OPERATIVE DIAGNOSIS:  Undesired Fertility  PROCEDURE:  Procedure(s): LAPAROSCOPIC BILATERAL SALPINGECTOMY (Bilateral)  SURGEON:  Surgeon(s) and Role:    * Raife Lizer, Wilhemina Cash, MD - Primary   ASSISTANTS: Ethelene Browns, MS3   ANESTHESIA:   none  EBL:  10 mL   BLOOD ADMINISTERED:none  DRAINS: none   LOCAL MEDICATIONS USED:  MARCAINE     SPECIMEN:  Source of Specimen:  both oviducts  DISPOSITION OF SPECIMEN:  PATHOLOGY  COUNTS:  YES  TOURNIQUET:  * No tourniquets in log *  DICTATION: .Dragon Dictation  PLAN OF CARE: Discharge to home after PACU  PATIENT DISPOSITION:  PACU - hemodynamically stable.   Delay start of Pharmacological VTE agent (>24hrs) due to surgical blood loss or risk of bleeding: not applicable  The risks, benefits, and alternatives of surgery were explained, understood, accepted. She is certain that she wants permanent sterility. She understands that this is not reversible. She understands there is a small failure rate of this procedure. She also would like to have both oviducts removed her due newest recommendations to help prevent ovarian/peritoneal cancer. In the operating room she was placed in the dorsal lithotomy position, and general anesthesia was given without complication. Her abdomen and vagina were prepped and draped in the usual sterile fashion. A timeout procedure was done. A bimanual exam revealed a normal size and shape anteverted and mobile uterus. Her adnexa felt normal. A Hulka manipulator was placed. Gloves were changed, and attention was turned to the abdomen. Approximately the 20mL of 0.5% Marcaine was injected into the umbilicus. A vertical incision was made at the site. A varies needle was placed intraperitoneally. Low-flow CO2 was used to insufflate the abdomen to approximately 3-1/2 L. Once a good pneumoperitoneum was  established, a 5 mm Excel trocar was placed. Laparoscopy confirmed correct placement. A 5 mm port was placed in the left lower quadrant and a 10 mm port in the right lower quadrant under direct laparoscopic visualization after injecting 0.5% Marcaine in the incision sites. Her pelvis and upper abdomen appeared normal. A Harmonic scapel was used to hemostatically removed both oviducts. I removed the oviducts through the 10 mm port. I removed the 5 mm ports and noted hemostasis. The right lower quadrant fascia was closed with a 0 vicryl suture using the [employer] fascial closure device. No defects were palpable. A subcuticular closure was done with 4-0 Vicryl suture at the 10 mm incision site. Dermabond was used at the other two sites.  She was extubated and taken to the recovery room in stable condition.

## 2018-03-14 NOTE — Anesthesia Postprocedure Evaluation (Signed)
Anesthesia Post Note  Patient: ADDYSIN PORCO  Procedure(s) Performed: LAPAROSCOPIC BILATERAL SALPINGECTOMY (Bilateral )     Patient location during evaluation: PACU Anesthesia Type: General Level of consciousness: awake and alert and oriented Pain management: pain level controlled Vital Signs Assessment: post-procedure vital signs reviewed and stable Respiratory status: spontaneous breathing, nonlabored ventilation and respiratory function stable Cardiovascular status: blood pressure returned to baseline and stable Postop Assessment: no apparent nausea or vomiting Anesthetic complications: no    Last Vitals:  Vitals:   03/14/18 1415 03/14/18 1450  BP: (!) 143/89 (!) 154/95  Pulse: 66 72  Resp: 15 16  Temp: 36.8 C 36.7 C  SpO2: 100% 100%    Last Pain:  Vitals:   03/14/18 1450  TempSrc:   PainSc: 0-No pain   Pain Goal: Patients Stated Pain Goal: 3 (03/14/18 1450)               Alexsandro Salek A.

## 2018-03-14 NOTE — Anesthesia Procedure Notes (Signed)
Procedure Name: Intubation Date/Time: 03/14/2018 12:22 PM Performed by: Bufford Spikes, CRNA Pre-anesthesia Checklist: Patient identified, Emergency Drugs available, Suction available and Patient being monitored Patient Re-evaluated:Patient Re-evaluated prior to induction Oxygen Delivery Method: Circle system utilized Preoxygenation: Pre-oxygenation with 100% oxygen Induction Type: IV induction Ventilation: Mask ventilation without difficulty Laryngoscope Size: Miller and 2 Grade View: Grade II Tube type: Oral Tube size: 7.0 mm Number of attempts: 1 Airway Equipment and Method: Stylet and Oral airway Placement Confirmation: ETT inserted through vocal cords under direct vision,  positive ETCO2 and breath sounds checked- equal and bilateral Secured at: 21 cm Tube secured with: Tape Dental Injury: Teeth and Oropharynx as per pre-operative assessment

## 2018-03-15 ENCOUNTER — Encounter (HOSPITAL_COMMUNITY): Payer: Self-pay | Admitting: Obstetrics & Gynecology

## 2018-04-19 ENCOUNTER — Encounter: Payer: Self-pay | Admitting: Family Medicine

## 2018-04-19 ENCOUNTER — Ambulatory Visit (INDEPENDENT_AMBULATORY_CARE_PROVIDER_SITE_OTHER): Payer: 59 | Admitting: Family Medicine

## 2018-04-19 VITALS — BP 152/92 | HR 90 | Resp 16 | Ht 67.0 in | Wt 275.0 lb

## 2018-04-19 DIAGNOSIS — Z9889 Other specified postprocedural states: Secondary | ICD-10-CM | POA: Diagnosis not present

## 2018-04-19 DIAGNOSIS — Z09 Encounter for follow-up examination after completed treatment for conditions other than malignant neoplasm: Secondary | ICD-10-CM

## 2018-04-19 NOTE — Progress Notes (Signed)
   Subjective:    Patient ID: Desiree Lawson is a 47 y.o. female presenting with Routine Post Op  on 04/19/2018  HPI: Here today for post op check. Doing well. Surgery was 1 month ago. S/p Bil salping for sterilization. Voiding normally. Normal Bowel function.  Review of Systems  Constitutional: Negative for chills and fever.  Respiratory: Negative for shortness of breath.   Cardiovascular: Negative for chest pain.  Gastrointestinal: Negative for abdominal pain, nausea and vomiting.  Genitourinary: Negative for dysuria.  Skin: Negative for rash.      Objective:    BP (!) 152/92   Pulse 90   Resp 16   Ht 5\' 7"  (1.702 m)   Wt 275 lb (124.7 kg)   LMP 04/18/2018   BMI 43.07 kg/m  Physical Exam  Constitutional: She is oriented to person, place, and time. She appears well-developed and well-nourished. No distress.  HENT:  Head: Normocephalic and atraumatic.  Eyes: No scleral icterus.  Neck: Neck supple.  Cardiovascular: Normal rate.  Pulmonary/Chest: Effort normal.  Abdominal: Soft.  Neurological: She is alert and oriented to person, place, and time.  Skin: Skin is warm and dry.  Incisions are well healed  Psychiatric: She has a normal mood and affect.        Assessment & Plan:  Postop check - doing well. massage areas where incisions were to soften up scar tissue.  Return if symptoms worsen or fail to improve.  Donnamae Jude 04/19/2018 5:56 PM

## 2018-11-08 ENCOUNTER — Other Ambulatory Visit: Payer: Self-pay | Admitting: Physician Assistant

## 2018-11-08 DIAGNOSIS — I10 Essential (primary) hypertension: Secondary | ICD-10-CM

## 2018-11-08 NOTE — Telephone Encounter (Signed)
Left message with information below and for patient to call us back to schedule an appointment or webex. °

## 2018-11-08 NOTE — Telephone Encounter (Signed)
Please call patient and schedule virtual/office visit with Jade for follow up prior to refills. Thanks!  

## 2018-12-24 ENCOUNTER — Encounter: Payer: 59 | Admitting: Physician Assistant

## 2018-12-25 ENCOUNTER — Other Ambulatory Visit: Payer: Self-pay

## 2018-12-25 ENCOUNTER — Other Ambulatory Visit (HOSPITAL_COMMUNITY)
Admission: RE | Admit: 2018-12-25 | Discharge: 2018-12-25 | Disposition: A | Discharge status: Home / Self Care | Payer: 59 | Source: Ambulatory Visit | Attending: Physician Assistant | Admitting: Physician Assistant

## 2018-12-25 ENCOUNTER — Ambulatory Visit (INDEPENDENT_AMBULATORY_CARE_PROVIDER_SITE_OTHER): Payer: 59 | Admitting: Physician Assistant

## 2018-12-25 ENCOUNTER — Encounter: Payer: Self-pay | Admitting: Physician Assistant

## 2018-12-25 VITALS — BP 139/84 | HR 90 | Temp 98.7°F | Ht 69.0 in | Wt 283.0 lb

## 2018-12-25 DIAGNOSIS — Z124 Encounter for screening for malignant neoplasm of cervix: Secondary | ICD-10-CM | POA: Diagnosis present

## 2018-12-25 DIAGNOSIS — Z131 Encounter for screening for diabetes mellitus: Secondary | ICD-10-CM

## 2018-12-25 DIAGNOSIS — Z1231 Encounter for screening mammogram for malignant neoplasm of breast: Secondary | ICD-10-CM

## 2018-12-25 DIAGNOSIS — I10 Essential (primary) hypertension: Secondary | ICD-10-CM

## 2018-12-25 DIAGNOSIS — Z13220 Encounter for screening for lipoid disorders: Secondary | ICD-10-CM

## 2018-12-25 DIAGNOSIS — Z0000 Encounter for general adult medical examination without abnormal findings: Secondary | ICD-10-CM | POA: Diagnosis not present

## 2018-12-25 DIAGNOSIS — Z114 Encounter for screening for human immunodeficiency virus [HIV]: Secondary | ICD-10-CM

## 2018-12-25 MED ORDER — AMLODIPINE BESYLATE 10 MG PO TABS: 10.0000 mg | ORAL_TABLET | Freq: Every evening | ORAL | 4 refills | Status: DC

## 2018-12-25 MED ORDER — NALTREXONE-BUPROPION HCL ER 8-90 MG PO TB12: ORAL_TABLET | ORAL | 0 refills | Status: DC

## 2018-12-25 MED ORDER — LOSARTAN POTASSIUM-HCTZ 100-25 MG PO TABS: 1.0000 | ORAL_TABLET | Freq: Every evening | ORAL | 4 refills | Status: DC

## 2018-12-25 NOTE — Progress Notes (Signed)
Subjective:     Desiree Lawson is a 48 y.o. female and is here for a comprehensive physical exam. The patient reports no problems.  Social History   Socioeconomic History  . Marital status: Married    Spouse name: Gerald Stabs  . Number of children: Not on file  . Years of education: Not on file  . Highest education level: Not on file  Occupational History  . Not on file  Social Needs  . Financial resource strain: Not on file  . Food insecurity    Worry: Not on file    Inability: Not on file  . Transportation needs    Medical: Not on file    Non-medical: Not on file  Tobacco Use  . Smoking status: Never Smoker  . Smokeless tobacco: Never Used  Substance and Sexual Activity  . Alcohol use: Yes    Alcohol/week: 3.0 - 5.0 standard drinks    Types: 3 - 5 Glasses of wine per week  . Drug use: No  . Sexual activity: Yes    Birth control/protection: Surgical  Lifestyle  . Physical activity    Days per week: Not on file    Minutes per session: Not on file  . Stress: Not on file  Relationships  . Social Herbalist on phone: Not on file    Gets together: Not on file    Attends religious service: Not on file    Active member of club or organization: Not on file    Attends meetings of clubs or organizations: Not on file    Relationship status: Not on file  . Intimate partner violence    Fear of current or ex partner: Not on file    Emotionally abused: Not on file    Physically abused: Not on file    Forced sexual activity: Not on file  Other Topics Concern  . Not on file  Social History Narrative  . Not on file   Health Maintenance  Topic Date Due  . HIV Screening  02/05/1988  . PAP SMEAR-Modifier  10/01/2018  . MAMMOGRAM  12/23/2018  . INFLUENZA VACCINE  01/12/2019  . TETANUS/TDAP  09/12/2021    The following portions of the patient's history were reviewed and updated as appropriate: allergies, current medications, past family history, past medical  history, past social history, past surgical history and problem list.  Review of Systems Pertinent items noted in HPI and remainder of comprehensive ROS otherwise negative.   Objective:    BP 139/84   Pulse 90   Temp 98.7 F (37.1 C) (Oral)   Ht 5\' 9"  (1.753 m)   Wt 283 lb (128.4 kg)   SpO2 100%   BMI 41.79 kg/m  General appearance: alert, cooperative, appears stated age and morbidly obese Head: Normocephalic, without obvious abnormality, atraumatic Eyes: conjunctivae/corneas clear. PERRL, EOM's intact. Fundi benign. Ears: normal TM's and external ear canals both ears Nose: Nares normal. Septum midline. Mucosa normal. No drainage or sinus tenderness. Throat: lips, mucosa, and tongue normal; teeth and gums normal Neck: no adenopathy, no carotid bruit, no JVD, supple, symmetrical, trachea midline and thyroid not enlarged, symmetric, no tenderness/mass/nodules Back: symmetric, no curvature. ROM normal. No CVA tenderness. Lungs: clear to auscultation bilaterally Breasts: normal appearance, no masses or tenderness Heart: regular rate and rhythm, S1, S2 normal, no murmur, click, rub or gallop Abdomen: soft, non-tender; bowel sounds normal; no masses,  no organomegaly Pelvic: cervix normal in appearance, external genitalia normal, no adnexal  masses or tenderness, no cervical motion tenderness, uterus normal size, shape, and consistency and vagina normal without discharge Extremities: extremities normal, atraumatic, no cyanosis or edema Pulses: 2+ and symmetric Skin: Skin color, texture, turgor normal. No rashes or lesions Lymph nodes: Cervical, supraclavicular, and axillary nodes normal. Neurologic: Alert and oriented X 3, normal strength and tone. Normal symmetric reflexes. Normal coordination and gait    Assessment:    Healthy female exam.      Plan:    Marland KitchenMarland KitchenPixie was seen today for annual exam.  Diagnoses and all orders for this visit:  Routine physical examination -      Lipid Panel w/reflex Direct LDL -     COMPLETE METABOLIC PANEL WITH GFR  Visit for screening mammogram -     MM 3D SCREEN BREAST BILATERAL  Screening for diabetes mellitus -     COMPLETE METABOLIC PANEL WITH GFR  Screening for lipid disorders -     Lipid Panel w/reflex Direct LDL  Pap smear for cervical cancer screening -     Cytology - PAP  Morbid obesity (HCC) -     TSH -     Naltrexone-buPROPion HCl ER 8-90 MG TB12; Take 1 tablet in am for 1 week, then increase to 1 tablet in am and 1 tablet in pm for 1 week, then increase to 2 tablets in am and 1 tablet in pm for 1 week, then increase to 2 tablets in am and 2 tablets in pm daily.  Encounter for screening for HIV -     HIV antibody (with reflex)  Hypertension, essential, benign -     amLODipine (NORVASC) 10 MG tablet; Take 1 tablet (10 mg total) by mouth every evening. -     losartan-hydrochlorothiazide (HYZAAR) 100-25 MG tablet; Take 1 tablet by mouth every evening.   .. Depression screen [facility] 2/9 12/25/2018 12/22/2017 11/29/2016  Decreased Interest 0 0 1  Down, Depressed, Hopeless 0 0 1  PHQ - 2 Score 0 0 2  Altered sleeping 0 0 -  Tired, decreased energy 1 1 -  Change in appetite 0 0 -  Feeling bad or failure about yourself  0 0 -  Trouble concentrating 0 0 -  Moving slowly or fidgety/restless 0 0 -  Suicidal thoughts 0 0 -  PHQ-9 Score 1 1 -  Difficult doing work/chores Not difficult at all Not difficult at all -   .. Discussed 150 minutes of exercise a week.  Encouraged vitamin D 1000 units and Calcium 1300mg  or 4 servings of dairy a day.  Fasting labs ordered.  Mammogram ordered.  Pap done today. Pt declined STD testing. HIV screening done.   [name].Discussed low carb diet with 1500 calories and 80g of protein.  Exercising at least 150 minutes a week.  [employer]itness [employer] could be a Microbiologist.  Discussed weight loss options. Sent contrave. Discussed how to use. Follow up virtually after 4 weeks on medication.     See After Visit Summary for Counseling Recommendations

## 2018-12-25 NOTE — Patient Instructions (Signed)
Health Maintenance, Female Adopting a healthy lifestyle and getting preventive care are important in promoting health and wellness. Ask your health care provider about:  The right schedule for you to have regular tests and exams.  Things you can do on your own to prevent diseases and keep yourself healthy. What should I know about diet, weight, and exercise? Eat a healthy diet   Eat a diet that includes plenty of vegetables, fruits, low-fat dairy products, and lean protein.  Do not eat a lot of foods that are high in solid fats, added sugars, or sodium. Maintain a healthy weight Body mass index (BMI) is used to identify weight problems. It estimates body fat based on height and weight. Your health care provider can help determine your BMI and help you achieve or maintain a healthy weight. Get regular exercise Get regular exercise. This is one of the most important things you can do for your health. Most adults should:  Exercise for at least 150 minutes each week. The exercise should increase your heart rate and make you sweat (moderate-intensity exercise).  Do strengthening exercises at least twice a week. This is in addition to the moderate-intensity exercise.  Spend less time sitting. Even light physical activity can be beneficial. Watch cholesterol and blood lipids Have your blood tested for lipids and cholesterol at 46 years of age, then have this test every 5 years. Have your cholesterol levels checked more often if:  Your lipid or cholesterol levels are high.  You are older than 46 years of age.  You are at high risk for heart disease. What should I know about cancer screening? Depending on your health history and family history, you may need to have cancer screening at various ages. This may include screening for:  Breast cancer.  Cervical cancer.  Colorectal cancer.  Skin cancer.  Lung cancer. What should I know about heart disease, diabetes, and high blood  pressure? Blood pressure and heart disease  High blood pressure causes heart disease and increases the risk of stroke. This is more likely to develop in people who have high blood pressure readings, are of African descent, or are overweight.  Have your blood pressure checked: ? Every 3-5 years if you are 18-39 years of age. ? Every year if you are 40 years old or older. Diabetes Have regular diabetes screenings. This checks your fasting blood sugar level. Have the screening done:  Once every three years after age 40 if you are at a normal weight and have a low risk for diabetes.  More often and at a younger age if you are overweight or have a high risk for diabetes. What should I know about preventing infection? Hepatitis B If you have a higher risk for hepatitis B, you should be screened for this virus. Talk with your health care provider to find out if you are at risk for hepatitis B infection. Hepatitis C Testing is recommended for:  Everyone born from 1945 through 1965.  Anyone with known risk factors for hepatitis C. Sexually transmitted infections (STIs)  Get screened for STIs, including gonorrhea and chlamydia, if: ? You are sexually active and are younger than 46 years of age. ? You are older than 46 years of age and your health care provider tells you that you are at risk for this type of infection. ? Your sexual activity has changed since you were last screened, and you are at increased risk for chlamydia or gonorrhea. Ask your health care provider if   you are at risk.  Ask your health care provider about whether you are at high risk for HIV. Your health care provider may recommend a prescription medicine to help prevent HIV infection. If you choose to take medicine to prevent HIV, you should first get tested for HIV. You should then be tested every 3 months for as long as you are taking the medicine. Pregnancy  If you are about to stop having your period (premenopausal) and  you may become pregnant, seek counseling before you get pregnant.  Take 400 to 800 micrograms (mcg) of folic acid every day if you become pregnant.  Ask for birth control (contraception) if you want to prevent pregnancy. Osteoporosis and menopause Osteoporosis is a disease in which the bones lose minerals and strength with aging. This can result in bone fractures. If you are 65 years old or older, or if you are at risk for osteoporosis and fractures, ask your health care provider if you should:  Be screened for bone loss.  Take a calcium or vitamin D supplement to lower your risk of fractures.  Be given hormone replacement therapy (HRT) to treat symptoms of menopause. Follow these instructions at home: Lifestyle  Do not use any products that contain nicotine or tobacco, such as cigarettes, e-cigarettes, and chewing tobacco. If you need help quitting, ask your health care provider.  Do not use street drugs.  Do not share needles.  Ask your health care provider for help if you need support or information about quitting drugs. Alcohol use  Do not drink alcohol if: ? Your health care provider tells you not to drink. ? You are pregnant, may be pregnant, or are planning to become pregnant.  If you drink alcohol: ? Limit how much you use to 0-1 drink a day. ? Limit intake if you are breastfeeding.  Be aware of how much alcohol is in your drink. In the [place] one drink equals one 12 oz bottle of beer (355 mL), one 5 oz glass of wine (148 mL), or one 1 oz glass of hard liquor (44 mL). General instructions  Schedule regular health, dental, and eye exams.  Stay current with your vaccines.  Tell your health care provider if: ? You often feel depressed. ? You have ever been abused or do not feel safe at home. Summary  Adopting a healthy lifestyle and getting preventive care are important in promoting health and wellness.  Follow your health care provider's instructions about healthy  diet, exercising, and getting tested or screened for diseases.  Follow your health care provider's instructions on monitoring your cholesterol and blood pressure. This information is not intended to replace advice given to you by your health care provider. Make sure you discuss any questions you have with your health care provider. Document Released: 12/13/2010 Document Revised: 05/23/2018 Document Reviewed: 05/23/2018 Elsevier Patient Education  2020 Elsevier Inc.  

## 2018-12-26 ENCOUNTER — Ambulatory Visit (INDEPENDENT_AMBULATORY_CARE_PROVIDER_SITE_OTHER): Payer: 59

## 2018-12-26 ENCOUNTER — Other Ambulatory Visit: Payer: Self-pay

## 2018-12-26 DIAGNOSIS — Z1231 Encounter for screening mammogram for malignant neoplasm of breast: Secondary | ICD-10-CM

## 2018-12-26 LAB — COMPLETE METABOLIC PANEL WITH GFR
AG Ratio: 1.1 (calc) (ref 1.0–2.5)
ALT: 21 U/L (ref 6–29)
AST: 48 U/L — ABNORMAL HIGH (ref 10–35)
Albumin: 4.1 g/dL (ref 3.6–5.1)
Alkaline phosphatase (APISO): 65 U/L (ref 31–125)
BUN: 16 mg/dL (ref 7–25)
CO2: 29 mmol/L (ref 20–32)
Calcium: 9.9 mg/dL (ref 8.6–10.2)
Chloride: 100 mmol/L (ref 98–110)
Creat: 1.01 mg/dL (ref 0.50–1.10)
GFR, Est African American: 78 mL/min/{1.73_m2} (ref >=60)
GFR, Est Non African American: 67 mL/min/{1.73_m2} (ref >=60)
Globulin: 3.8 g/dL (calc) — ABNORMAL HIGH (ref 1.9–3.7)
Glucose, Bld: 101 mg/dL — ABNORMAL HIGH (ref 65–99)
Potassium: 4 mmol/L (ref 3.5–5.3)
Sodium: 136 mmol/L (ref 135–146)
Total Bilirubin: 0.4 mg/dL (ref 0.2–1.2)
Total Protein: 7.9 g/dL (ref 6.1–8.1)

## 2018-12-26 LAB — LIPID PANEL W/REFLEX DIRECT LDL
Cholesterol: 170 mg/dL (ref <200)
HDL: 50 mg/dL (ref >=50)
LDL Cholesterol (Calc): 105 mg/dL (calc) — ABNORMAL HIGH
Non-HDL Cholesterol (Calc): 120 mg/dL (calc) (ref <130)
Total CHOL/HDL Ratio: 3.4 (calc) (ref <5.0)
Triglycerides: 66 mg/dL (ref <150)

## 2018-12-26 LAB — TSH: TSH: 2.51 mIU/L

## 2018-12-26 LAB — HIV ANTIBODY (ROUTINE TESTING W REFLEX): HIV 1&2 Ab, 4th Generation: NONREACTIVE

## 2018-12-26 NOTE — Progress Notes (Signed)
Normal mammogram. Follow up in one year.

## 2018-12-26 NOTE — Progress Notes (Signed)
Cholesterol went up a little from one year ago. Liver enzyme is hair elevated but stable from previous years. Fasting sugar is just a hair elevated.   Working on weight loss would likely get all numbers a little better. Overall good labs though just things to watch.

## 2018-12-28 LAB — CYTOLOGY - PAP
Diagnosis: NEGATIVE
HPV: NOT DETECTED

## 2018-12-28 NOTE — Progress Notes (Signed)
Pap normal cells. No HPV. Follow up pap in 5 years.

## 2019-01-07 ENCOUNTER — Encounter: Payer: Self-pay | Admitting: Physician Assistant

## 2019-01-11 ENCOUNTER — Telehealth: Payer: Self-pay | Admitting: Physician Assistant

## 2019-01-11 NOTE — Telephone Encounter (Signed)
Received fax from [employer] that Contrave requires a PA. Information has been sent to the insurance company. Awaiting determination.

## 2019-01-15 NOTE — Telephone Encounter (Signed)
Received fax from [place] that Contrave was approved from 01/11/2019 through 05/11/2019. Pharmacy notified and forms sent to scan.

## 2019-02-09 ENCOUNTER — Other Ambulatory Visit: Payer: Self-pay | Admitting: Physician Assistant

## 2019-02-11 NOTE — Telephone Encounter (Signed)
Patient doing well. Follow up appt made. RX sent.

## 2019-02-11 NOTE — Telephone Encounter (Signed)
Call make sure tolerating well. We can give her maintance pack if doing well. Needs one month virual follow up.

## 2019-03-13 ENCOUNTER — Ambulatory Visit (INDEPENDENT_AMBULATORY_CARE_PROVIDER_SITE_OTHER): Payer: 59 | Admitting: Physician Assistant

## 2019-03-13 ENCOUNTER — Other Ambulatory Visit: Payer: Self-pay

## 2019-03-13 ENCOUNTER — Encounter: Payer: Self-pay | Admitting: Physician Assistant

## 2019-03-13 DIAGNOSIS — I10 Essential (primary) hypertension: Secondary | ICD-10-CM

## 2019-03-13 DIAGNOSIS — R200 Anesthesia of skin: Secondary | ICD-10-CM | POA: Insufficient documentation

## 2019-03-13 MED ORDER — CONTRAVE 8-90 MG PO TB12: 2.0000 | ORAL_TABLET | Freq: Two times a day (BID) | ORAL | 2 refills | Status: DC

## 2019-03-13 MED ORDER — VALSARTAN-HYDROCHLOROTHIAZIDE 160-25 MG PO TABS: 1.0000 | ORAL_TABLET | Freq: Every day | ORAL | 0 refills | Status: DC

## 2019-03-13 MED ORDER — DICLOFENAC SODIUM 1 % TD GEL: 4.0000 g | Freq: Four times a day (QID) | TRANSDERMAL | 1 refills | Status: DC

## 2019-03-13 NOTE — Progress Notes (Signed)
Subjective:    Patient ID: Desiree Lawson, female    DOB: 1971/05/29, 48 y.o.   MRN: [id number]  HPI  Patient is a 48 y/o morbidly obese female with HTN presents to the clinic for follow-up after starting contrave a few months ago.   Patient is overall doing well. She is tolerating contrave, reports no intolerable adverse effects. States that once in a while she may get a little nauseous but its tolerable. Denies mood and bowel changes. She is exercising several times a week and watching her diet. Comprised of fit&lean smoothies, small salads, and small sandwiches.   Patient has been taking her blood pressure medications as prescribed, at night. She does not regularly check her BP at home, but does have a cuff. Denies CP, palpitations, headache.   She also complains of numbness of her right hand, is concerned it may be carpal tunnel. The tingling is on the forearm and dorsal side of the hand, does not go into the fingers. 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 These symptoms have been coming and going for the past 2 months, have gotten worse since she has been working from home. She has not tried anything to relieve the symptoms.   .. Active Ambulatory Problems    Diagnosis Date Noted  . Hypertension, essential, benign 09/13/2011  . Other fatigue 02/27/2015  . Vitamin D deficiency 03/01/2015  . Morbid obesity due to excess calories ([place]) 03/26/2015  . Numbness of right hand 03/13/2019   Resolved Ambulatory Problems    Diagnosis Date Noted  . No Resolved Ambulatory Problems   Past Medical History:  Diagnosis Date  . Hypertension   . PONV (postoperative nausea and vomiting)   . Seasonal allergies   . SVD (spontaneous vaginal delivery)   . Termination of pregnancy (fetus)      Review of Systems  HENT: Negative for hearing loss and tinnitus.   Eyes: Negative for pain and visual  disturbance.       Negative for vision changes.   Respiratory: Negative for chest tightness and shortness of breath.   Cardiovascular: Negative for chest pain and palpitations.  Neurological: Negative for light-headedness and headaches.  Psychiatric/Behavioral: Negative for behavioral problems and dysphoric mood. The patient is not nervous/anxious.        Objective:   Physical Exam Constitutional:      Appearance: Normal appearance. She is obese.  HENT:     Head: Normocephalic and atraumatic.     Mouth/Throat:     Mouth: Mucous membranes are moist.     Pharynx: Oropharynx is clear.  Eyes:     Extraocular Movements: Extraocular movements intact.     Conjunctiva/sclera: Conjunctivae normal.     Pupils: Pupils are equal, round, and reactive to light.  Cardiovascular:     Rate and Rhythm: Normal rate and regular rhythm.     Heart sounds: No murmur.  Pulmonary:     Effort: Pulmonary effort is normal.     Breath sounds: Normal breath sounds.  Musculoskeletal:     Right wrist: She exhibits normal range of motion, no tenderness and no deformity.  Skin:    General: Skin is warm and dry.  Neurological:     Mental Status: She is alert and oriented to person, place, and time.     Motor: No weakness (grip strength intact).     Comments: Negative phalen and  tinel signs.   Psychiatric:        Mood and Affect: Mood normal.        Behavior: Behavior normal.    .. Today's Vitals   03/13/19 0830  BP: (!) 153/84  Pulse: 88  SpO2: 99%  Weight: 277 lb (125.6 kg)  Height: 5\' 9"  (1.753 m)   Body mass index is 40.91 kg/m.  Manual BP RUE: 140/90      Assessment & Plan:  Marland KitchenMarland KitchenDaileen was seen today for obesity.  Diagnoses and all orders for this visit:  Morbid obesity due to excess calories (HCC) -     Naltrexone-buPROPion HCl ER (CONTRAVE) 8-90 MG TB12; Take 2 tablets by mouth 2 (two) times daily.  Numbness of right hand -     diclofenac sodium (VOLTAREN) 1 % GEL; Apply 4 g  topically 4 (four) times daily. To affected joint.  Essential hypertension -     valsartan-hydrochlorothiazide (DIOVAN HCT) 160-25 MG tablet; Take 1 tablet by mouth daily.   BP still uncontrolled - switched from losartan/hctz to valsartan/hctz combo. Patient said she will start checking BP at home. Continue norvasc. Goal BP is under 140/90.  Discussed 150 minutes of exercise/week, continue low carb diet.  Patient has lost approx. 6lbs since July.  Doing well on contrave, will continue.   Numb-like symptoms do not seem like medial nerve entrapment, but likely more ulnar nerve. Encouraged patient to use voltaren gel, use wrist rests/mousepads, and try using left hand to move the mouse during flares. Can use NSAIDs as needed for short burst.  Will continue to monitor. If continues to be problematic consider EMG and then imaging to look for nerve entrapment.   Follow up in 3 months.

## 2019-04-03 ENCOUNTER — Encounter: Payer: Self-pay | Admitting: Neurology

## 2019-04-11 ENCOUNTER — Other Ambulatory Visit: Payer: Self-pay | Admitting: Physician Assistant

## 2019-04-11 DIAGNOSIS — I10 Essential (primary) hypertension: Secondary | ICD-10-CM

## 2019-05-06 ENCOUNTER — Telehealth: Payer: Self-pay | Admitting: Neurology

## 2019-05-06 MED ORDER — RIZATRIPTAN BENZOATE 10 MG PO TBDP: 10.0000 mg | ORAL_TABLET | ORAL | 0 refills | Status: DC | PRN

## 2019-05-06 NOTE — Telephone Encounter (Signed)
Patient left vm stating she has a migraine and would like something called in.   Called back and spoke with patient. She has a history of migraines, but has not had one in over 10 years. Started yesterday with pain. She is having nausea and one episode of vomiting. No sound or light sensitivity. She has tried ibuprofen 400 mg which helped some and aleve which is not helping. Please advise.

## 2019-05-06 NOTE — Telephone Encounter (Signed)
Sent maxalt for rescue if not improving in next 24 hours come in for abortive migraine cocktail in office.

## 2019-05-06 NOTE — Telephone Encounter (Signed)
Patient made aware of instructions. She expresses understanding.

## 2019-05-29 ENCOUNTER — Other Ambulatory Visit: Payer: Self-pay | Admitting: Physician Assistant

## 2019-06-05 ENCOUNTER — Ambulatory Visit (INDEPENDENT_AMBULATORY_CARE_PROVIDER_SITE_OTHER): Payer: 59 | Admitting: Physician Assistant

## 2019-06-05 ENCOUNTER — Encounter: Payer: Self-pay | Admitting: Physician Assistant

## 2019-06-05 DIAGNOSIS — R200 Anesthesia of skin: Secondary | ICD-10-CM

## 2019-06-05 DIAGNOSIS — I10 Essential (primary) hypertension: Secondary | ICD-10-CM

## 2019-06-05 DIAGNOSIS — M85641 Other cyst of bone, right hand: Secondary | ICD-10-CM

## 2019-06-05 MED ORDER — DICLOFENAC SODIUM 1 % EX GEL: 4.0000 g | Freq: Four times a day (QID) | CUTANEOUS | 11 refills | Status: DC

## 2019-06-05 MED ORDER — VALSARTAN-HYDROCHLOROTHIAZIDE 320-25 MG PO TABS: 1.0000 | ORAL_TABLET | Freq: Every day | ORAL | 0 refills | Status: DC

## 2019-06-05 MED ORDER — CONTRAVE 8-90 MG PO TB12: 2.0000 | ORAL_TABLET | Freq: Two times a day (BID) | ORAL | 2 refills | Status: DC

## 2019-06-05 NOTE — Progress Notes (Deleted)
Last weight - 277lbs Today's weight - 274 lbs  Small knot on right hand below thumb - noticed last week Not painful unless you push on it  Patient will check blood pressure and give reading to [facility] during visit - machine wouldn't turn on when we were speaking

## 2019-06-10 ENCOUNTER — Encounter: Payer: Self-pay | Admitting: Physician Assistant

## 2019-06-11 ENCOUNTER — Encounter: Payer: Self-pay | Admitting: Physician Assistant

## 2019-06-11 DIAGNOSIS — M85641 Other cyst of bone, right hand: Secondary | ICD-10-CM | POA: Insufficient documentation

## 2019-06-11 DIAGNOSIS — R200 Anesthesia of skin: Secondary | ICD-10-CM | POA: Insufficient documentation

## 2019-06-11 NOTE — Telephone Encounter (Signed)
As long as you remain covered by your prescription drug plan and there are no changes to your plan benefits, this request is approved for the following time period: 06/11/2019 - 05/26/2022-, Pharmacy aware.

## 2019-06-11 NOTE — Progress Notes (Signed)
Patient ID: Desiree Lawson, female   DOB: 1970/08/04, 48 y.o.   MRN: [id number] .Marland KitchenVirtual Visit via Video Note  I connected with Desiree Lawson on 06/05/19 at  8:30 AM EST by a video enabled telemedicine application and verified that I am speaking with the correct person using two identifiers.  Location: Patient: home Provider: home   I discussed the limitations of evaluation and management by telemedicine and the availability of in person appointments. The patient expressed understanding and agreed to proceed.  History of Present Illness: Pt is a 48 yo obese female with HTN who calls into the clinic for medication follow up on contrave.   She is doing well with weight loss. She has lost 3-5lbs since last visit. She is eating better and trying to stay more active. She is on contrave with no concerns.   She denies any CP, palpitations, headaches or dizziness. She is taking her BP medication. Her BP remains elevated.   Pt noticed a cyst on her right base of her thumb for the last week. Tender to touch. Not warm. No trauma. She has been having right hand numbness but now it is more like right forearm numbness. It is from elbow to wrist. Worst with working. No strength change.no neck pain.  .. Active Ambulatory Problems    Diagnosis Date Noted  . Hypertension, essential, benign 09/13/2011  . Other fatigue 02/27/2015  . Vitamin D deficiency 03/01/2015  . Morbid obesity due to excess calories ([place]) 03/26/2015  . Numbness of right hand 03/13/2019  . Cyst of bone of right hand 06/11/2019   Resolved Ambulatory Problems    Diagnosis Date Noted  . No Resolved Ambulatory Problems   Past Medical History:  Diagnosis Date  . Hypertension   . PONV (postoperative nausea and vomiting)   . Seasonal allergies   . SVD (spontaneous vaginal delivery)   . Termination of pregnancy (fetus)    Reviewed med, allergy, problem list.     Observations/Objective: NO acute distress.   Points to firm mobile cyst at base of thumb at [facility]. Tender to touch.  Normal mood and appearance.   .. Today's Vitals   06/05/19 0730  Weight: 274 lb (124.3 kg)  Height: 5\' 9"  (1.753 m)   Body mass index is 40.46 kg/m.    Assessment and Plan: Marland KitchenMarland KitchenTykeisha was seen today for weight check.  Diagnoses and all orders for this visit:  Morbid obesity due to excess calories (HCC) -     Naltrexone-buPROPion HCl ER (CONTRAVE) 8-90 MG TB12; Take 2 tablets by mouth 2 (two) times daily.  Hypertension, essential, benign -     valsartan-hydrochlorothiazide (DIOVAN HCT) 320-25 MG tablet; Take 1 tablet by mouth daily.  Cyst of bone of right hand -     diclofenac Sodium (VOLTAREN) 1 % GEL; Apply 4 g topically 4 (four) times daily. To affected joint.   BP not to goal. Increased diovan. Follow up in 4 weeks for BP recheck.   Doing well with weight loss. Continue on contrave. Consider IF 16:8.   From virtual appearance looks like ganglion cyst. Discussed diclofenac gel. If bothersome come have injected and drained or at least evaluated by Dr. Darene Lamer. Pt had complained of right hand numbness but today she only complains of right forearm numbness. Would like to get EMGs to further evaluate numbness and tingling.    Follow Up Instructions:    I discussed the assessment and treatment plan with the patient. The patient was provided an  opportunity to ask questions and all were answered. The patient agreed with the plan and demonstrated an understanding of the instructions.   The patient was advised to call back or seek an in-person evaluation if the symptoms worsen or if the condition fails to improve as anticipated.   Iran Planas, PA-C

## 2019-06-12 ENCOUNTER — Ambulatory Visit: Payer: 59 | Admitting: Physician Assistant

## 2019-07-31 ENCOUNTER — Other Ambulatory Visit: Payer: Self-pay

## 2019-07-31 ENCOUNTER — Encounter (INDEPENDENT_AMBULATORY_CARE_PROVIDER_SITE_OTHER): Payer: 59 | Admitting: Neurology

## 2019-07-31 ENCOUNTER — Ambulatory Visit (INDEPENDENT_AMBULATORY_CARE_PROVIDER_SITE_OTHER): Payer: 59 | Admitting: Neurology

## 2019-07-31 DIAGNOSIS — G5603 Carpal tunnel syndrome, bilateral upper limbs: Secondary | ICD-10-CM | POA: Diagnosis not present

## 2019-07-31 DIAGNOSIS — Z0289 Encounter for other administrative examinations: Secondary | ICD-10-CM

## 2019-07-31 NOTE — Procedures (Signed)
Full Name: Desiree Lawson Gender: Female MRN #: [id number] Date of Birth: [birth year]    Visit Date: 07/31/2019 09:51 Age: 49 Years Examining Physician: Marcial Pacas, MD  Referring Physician: Iran Planas, PA History: 49 year old female, complains of few months history of bilateral hands paresthesia, right worse than left, she does a lot of typing.  On examination: Bilateral upper extremity proximal and distal muscle strength is normal, including bilateral abductor pollicis brevis, opponens.  Sensory examination demonstrated decreased pinprick at right first 3 fingertips.  Deep tendon reflexes were normal and symmetric  Summary of the tests:  Nerve conduction study: Bilateral ulnar sensory and motor responses were normal.  Bilateral median sensory responses showed borderline peak latency with normal snap amplitude.  Bilateral mixed response showed prolonged right median transcarpal conduction in comparison to the ipsilateral ulnar nerve, right side was 0.8 ms prolonged, left side was 0.7.  Electromyography:  Selected needle examination of right upper extremity muscles were normal.  Conclusion: This is an abnormal study.  There is electrodiagnostic evidence of bilateral median neuropathy across the wrist, consistent with mild bilateral carpal tunnel syndrome.    ------------------------------- Physician Name, M.D.  [facility] [place], [place] [address] Tel: [phone] Fax: [phone]         [facility]    Nerve / Sites Muscle Latency Ref. Amplitude Ref. Rel Amp Segments Distance Velocity Ref. Area    ms ms mV mV %  cm m/s m/s mVms  R Median - APB     Wrist APB 3.6 ?4.4 9.1 ?4.0 100 Wrist - APB 7   31.2     Upper arm APB 7.6  8.4  92.7 Upper arm - Wrist 24 60 ?49 31.2  L Median - APB     Wrist APB 3.6 ?4.4 10.2 ?4.0 100 Wrist - APB 7   34.2     Upper arm APB 7.6  10.1  98.2 Upper arm - Wrist 24 59 ?49 33.1  R Ulnar - ADM   Wrist ADM 2.4 ?3.3 12.3 ?6.0 100 Wrist - ADM 7   38.7     B.Elbow ADM 5.9  12.0  97.5 B.Elbow - Wrist 23 65 ?49 38.6     A.Elbow ADM 7.4  11.7  97.8 A.Elbow - B.Elbow 10 66 ?49 38.3         A.Elbow - Wrist               SNC    Nerve / Sites Rec. Site Peak Lat Ref.  Amp Ref. Segments Distance Peak Diff Ref.    ms ms V V  cm ms ms  R Median, Ulnar - Transcarpal comparison     Median Palm Wrist 2.5 ?2.2 82 ?35 Median Palm - Wrist 8       Ulnar Palm Wrist 1.7 ?2.2 22 ?12 Ulnar Palm - Wrist 8          Median Palm - Ulnar Palm  0.8 ?0.4  L Median, Ulnar - Transcarpal comparison     Median Palm Wrist 2.4 ?2.2 91 ?35 Median Palm - Wrist 8       Ulnar Palm Wrist 1.6 ?2.2 24 ?12 Ulnar Palm - Wrist 8          Median Palm - Ulnar Palm  0.7 ?0.4  R Median - Orthodromic (Dig II, Mid palm)     Dig II Wrist 3.4 ?3.4 12 ?10 Dig II - Wrist 13  L Median - Orthodromic (Dig II, Mid palm)     Dig II Wrist 3.4 ?3.4 13 ?10 Dig II - Wrist 13    R Ulnar - Orthodromic, (Dig V, Mid palm)     Dig V Wrist 2.4 ?3.1 10 ?5 Dig V - Wrist 11    L Ulnar - Orthodromic, (Dig V, Mid palm)     Dig V Wrist 2.4 ?3.1 9 ?5 Dig V - Wrist 55                   F  Wave    Nerve F Lat Ref.   ms ms  R Ulnar - ADM 25.5 ?32.0       EMG Summary Table    Spontaneous MUAP Recruitment  Muscle IA Fib PSW Fasc Other Amp Dur. Poly Pattern  R. Opponens pollicis Normal None None None _______ Normal Normal Normal Normal  R. First dorsal interosseous Normal None None None _______ Normal Normal Normal Normal  R. Abductor pollicis brevis Normal None None None _______ Normal Normal Normal Normal  R. Pronator teres Normal None None None _______ Normal Normal Normal Normal  R. Brachioradialis Normal None None None _______ Normal Normal Normal Normal  R. Extensor digitorum communis Normal None None None _______ Normal Normal Normal Normal  R. Flexor digitorum profundus (Ulnar) Normal None None None _______ Normal Normal Normal Normal

## 2019-08-01 ENCOUNTER — Encounter: Payer: Self-pay | Admitting: Physician Assistant

## 2019-08-01 NOTE — Progress Notes (Signed)
Desiree Lawson,   EMG results are back and you do have bilateral right and left carpal tunnel. Are you ok with referral to surgeon?

## 2019-09-10 ENCOUNTER — Encounter: Payer: Self-pay | Admitting: Physician Assistant

## 2019-09-17 NOTE — Telephone Encounter (Signed)
Can you send soft brace pictures of carpal tunnel splints to patient.

## 2019-12-02 ENCOUNTER — Ambulatory Visit (INDEPENDENT_AMBULATORY_CARE_PROVIDER_SITE_OTHER): Payer: No Typology Code available for payment source | Admitting: Physician Assistant

## 2019-12-02 ENCOUNTER — Other Ambulatory Visit: Payer: Self-pay

## 2019-12-02 ENCOUNTER — Encounter: Payer: Self-pay | Admitting: Physician Assistant

## 2019-12-02 VITALS — BP 150/85 | HR 84 | Temp 99.2°F | Wt 284.0 lb

## 2019-12-02 DIAGNOSIS — Z1159 Encounter for screening for other viral diseases: Secondary | ICD-10-CM | POA: Diagnosis not present

## 2019-12-02 DIAGNOSIS — Z13220 Encounter for screening for lipoid disorders: Secondary | ICD-10-CM

## 2019-12-02 DIAGNOSIS — I10 Essential (primary) hypertension: Secondary | ICD-10-CM | POA: Diagnosis not present

## 2019-12-02 DIAGNOSIS — L723 Sebaceous cyst: Secondary | ICD-10-CM

## 2019-12-02 DIAGNOSIS — Z6841 Body Mass Index (BMI) 40.0 and over, adult: Secondary | ICD-10-CM

## 2019-12-02 MED ORDER — VALSARTAN-HYDROCHLOROTHIAZIDE 320-25 MG PO TABS: 1.0000 | ORAL_TABLET | Freq: Every day | ORAL | 3 refills | Status: DC

## 2019-12-02 NOTE — Patient Instructions (Addendum)
Wegovy titration once weekly go up every 4 weeks.   Epidermal Cyst Removal  Epidermal cyst removal is a procedure to remove a sac of oily material (keratin) that has formed under your skin (epidermal cyst). Epidermal cysts may also be called epidermoid cysts, sebaceous cysts, or keratin cysts. Normally, the skin secretes this oily material through a gland or a hair follicle. However, when a skin gland or hair follicle becomes blocked, an epidermal cyst can form. You may need this procedure if you have an epidermal cyst that becomes large, uncomfortable, or infected. Tell a health care provider about:  Any allergies you have.  All medicines you are taking, including vitamins, herbs, eye drops, creams, and over-the-counter medicines.  Any problems you or family members have had with anesthetic medicines.  Any blood disorders you have.  Any surgeries you have had.  Any medical conditions you have now or have had.  Whether you are pregnant or may be pregnant. What are the risks? Generally, this is a safe procedure. However, problems may occur, including:  Development of another cyst.  Bleeding.  Infection.  Scarring. What happens before the procedure?  Ask your health care provider about: ? Changing or stopping your regular medicines. This is especially important if you are taking diabetes medicines or blood thinners. ? Taking medicines such as aspirin and ibuprofen. These medicines can thin your blood. Do not take these medicines unless your health care provider tells you to take them. ? Taking over-the-counter medicines, vitamins, herbs, and supplements.  If you have an infected cyst, you may have to take antibiotic medicine before the cyst removal. Take your antibiotic as told by your health care provider. Do not stop taking the antibiotic even if you start to feel better.  Take a shower on the morning of your procedure. Your health care provider may ask you to use a  germ-killing soap. What happens during the procedure?   You will be given a medicine to numb the area (local anesthetic).  The skin around the cyst will be cleaned with a germ-killing solution.  The health care provider will make a small incision in your skin over the cyst.  The health care provider will separate the cyst from the surrounding tissues that are under your skin.  If possible, the cyst will be removed undamaged (intact).  If the cyst bursts (ruptures), it will be removed in pieces.  After the cyst is removed, the health care provider will control any bleeding and close the incision with small stitches (sutures). Small incisions may not need sutures, and the bleeding will be controlled by applying direct pressure with gauze.  The health care provider may apply antibiotic ointment and a bandage (dressing) over the incision. The procedure may vary among health care providers and hospitals. What happens after the procedure?  If your cyst ruptured during the procedure, you may need an antibiotic. If you are prescribed an antibiotic medicine or ointment, take or apply it as told by your health care provider. Do not stop using the antibiotic even if you start to feel better. Summary  Epidermal cyst removal is a procedure to remove a sac of oily material (keratin) that has formed under your skin (epidermal cyst).  You may need this procedure if you have an epidermal cyst that becomes large, uncomfortable, or infected.  The health care provider will make a small incision in your skin to remove the cyst.  If you are prescribed an antibiotic medicine before the procedure, after  the procedure, or both, use the antibiotic as told by your health care provider. Do not stop using the antibiotic even if you start to feel better. This information is not intended to replace advice given to you by your health care provider. Make sure you discuss any questions you have with your health care  provider. Document Revised: 09/20/2018 Document Reviewed: 03/23/2017 Elsevier Patient Education  [place].

## 2019-12-02 NOTE — Progress Notes (Signed)
Subjective:    Patient ID: Desiree Lawson, female    DOB: Oct 31, 1970, 49 y.o.   MRN: [id number]  HPI  Pt is a 49 yo obese female who presents to the clinic to discuss mass of lower neck. She has noticed for the past few months but getting bigger. It is not itchy, painful, red or swollen. She has not had anything like this before.   She continues to try to lose weight. She is on contrave but just not had great results. She is trying to be active. She wonders about other options.    No CP, palpitations, headaches, or vision changes. She has been on of diovan for 2 weeks.  .. Active Ambulatory Problems    Diagnosis Date Noted   Hypertension, essential, benign 09/13/2011   Other fatigue 02/27/2015   Vitamin D deficiency 03/01/2015   Class 3 severe obesity due to excess calories with serious comorbidity and body mass index (BMI) of 40.0 to 44.9 in adult ([place]) 03/26/2015   Numbness of right hand 03/13/2019   Cyst of bone of right hand 06/11/2019   Right arm numbness 06/11/2019   Bilateral carpal tunnel syndrome 07/31/2019   Sebaceous cyst 12/02/2019   Resolved Ambulatory Problems    Diagnosis Date Noted   No Resolved Ambulatory Problems   Past Medical History:  Diagnosis Date   Hypertension    PONV (postoperative nausea and vomiting)    Seasonal allergies    SVD (spontaneous vaginal delivery)    Termination of pregnancy (fetus)      Review of Systems See HPI.     Objective:   Physical Exam Vitals reviewed.  Constitutional:      Appearance: Normal appearance. She is obese.  Neck:   Cardiovascular:     Rate and Rhythm: Normal rate and regular rhythm.     Pulses: Normal pulses.     Heart sounds: Normal heart sounds.  Pulmonary:     Effort: Pulmonary effort is normal.  Neurological:     Mental Status: She is alert.  Psychiatric:        Mood and Affect: Mood normal.        Behavior: Behavior normal.           Assessment & Plan:    Marland KitchenMarland KitchenRhena was seen today for cyst.  Diagnoses and all orders for this visit:  Sebaceous cyst -     Ambulatory referral to Dermatology  Hypertension, essential, benign -     valsartan-hydrochlorothiazide (DIOVAN HCT) 320-25 MG tablet; Take 1 tablet by mouth daily. -     COMPLETE METABOLIC PANEL WITH GFR  Screening for lipid disorders -     Lipid Panel w/reflex Direct LDL  Encounter for hepatitis C screening test for low risk patient -     Hepatitis C Antibody  Class 3 severe obesity due to excess calories with serious comorbidity and body mass index (BMI) of 40.0 to 44.9 in adult (HCC) -     AMBULATORY NON FORMULARY MEDICATION; [facility] auto-injection. Apply .25mg  injectable pen [place] once weekly.   BP elevated but out of meds for 2 weeks. Refilled medications. Keep BP log. Follow up in 2 months.   Needs fasting labs.ordered today.   Reassured pt that mass is sebaceous cyst. Referral made to derm for removal.   ..Discussed low carb diet with 1500 calories and 80g of protein.  Exercising at least 150 minutes a week.  [employer] could be a Microbiologist.  Consider IF 16:8.  wegovy prescribed. Discussed side effects. Stop contrave.  Follow up in 2 months.

## 2019-12-03 ENCOUNTER — Encounter: Payer: Self-pay | Admitting: Physician Assistant

## 2019-12-03 MED ORDER — AMBULATORY NON FORMULARY MEDICATION: 0 refills | Status: DC

## 2020-02-06 ENCOUNTER — Other Ambulatory Visit: Payer: Self-pay | Admitting: Physician Assistant

## 2020-02-06 DIAGNOSIS — Z1231 Encounter for screening mammogram for malignant neoplasm of breast: Secondary | ICD-10-CM

## 2020-02-10 ENCOUNTER — Telehealth (INDEPENDENT_AMBULATORY_CARE_PROVIDER_SITE_OTHER): Payer: No Typology Code available for payment source | Admitting: Physician Assistant

## 2020-02-10 ENCOUNTER — Encounter: Payer: Self-pay | Admitting: Physician Assistant

## 2020-02-10 ENCOUNTER — Other Ambulatory Visit: Payer: Self-pay | Admitting: Physician Assistant

## 2020-02-10 VITALS — BP 154/96 | HR 88 | Ht 69.0 in | Wt 284.0 lb

## 2020-02-10 DIAGNOSIS — N938 Other specified abnormal uterine and vaginal bleeding: Secondary | ICD-10-CM | POA: Diagnosis not present

## 2020-02-10 DIAGNOSIS — Z1159 Encounter for screening for other viral diseases: Secondary | ICD-10-CM

## 2020-02-10 DIAGNOSIS — Z13220 Encounter for screening for lipoid disorders: Secondary | ICD-10-CM

## 2020-02-10 DIAGNOSIS — N920 Excessive and frequent menstruation with regular cycle: Secondary | ICD-10-CM | POA: Insufficient documentation

## 2020-02-10 DIAGNOSIS — I10 Essential (primary) hypertension: Secondary | ICD-10-CM

## 2020-02-10 DIAGNOSIS — Z131 Encounter for screening for diabetes mellitus: Secondary | ICD-10-CM

## 2020-02-10 MED ORDER — MEDROXYPROGESTERONE ACETATE 10 MG PO TABS: 10.0000 mg | ORAL_TABLET | Freq: Every day | ORAL | 0 refills | Status: DC

## 2020-02-10 MED ORDER — WEGOVY 0.25 MG/0.5ML ~~LOC~~ SOAJ: 0.2500 mg | SUBCUTANEOUS | 0 refills | Status: DC

## 2020-02-10 NOTE — Progress Notes (Signed)
Patient ID: Desiree Lawson, female   DOB: July 03, 1970, 49 y.o.   MRN: [id number]    .Desiree KitchenVirtual Visit via Video Note  I connected with Desiree Lawson on 02/10/20 at 10:30 AM EDT by a video enabled telemedicine application and verified that I am speaking with the correct person using two identifiers.  Location: Patient: home Provider: clinic   I discussed the limitations of evaluation and management by telemedicine and the availability of in person appointments. The patient expressed understanding and agreed to proceed.  History of Present Illness: Patient is a 49 year old obese female who comes into the clinic with abnormal uterine bleeding.  Patient's menstrual period has been irregular for the last 8 to 9 months.  This cycle started on August 12 and has not stopped.  It slowed down for 1 day but then started back up.  She is having some mild abdominal cramping and lots of heavy blood flow with some clots.  She denies any fever, chills, nausea, body aches.  She has not done anything to make better.  She is not on any hormones   Patient was not able to get [facility]govy at [facility].  Patient needs sent to Desiree Lawson.  Patient admits to being out of blood pressure medicine for the last 3 to 4 days because she was on vacation.  She just took her first dose this morning.  She denies any chest pain, palpitations, headaches, vision changes.  .. Active Ambulatory Problems    Diagnosis Date Noted  . Hypertension, essential, benign 09/13/2011  . Other fatigue 02/27/2015  . Vitamin D deficiency 03/01/2015  . Class 3 severe obesity due to excess calories with serious comorbidity and body mass index (BMI) of 40.0 to 44.9 in adult ([place]) 03/26/2015  . Numbness of right hand 03/13/2019  . Cyst of bone of right hand 06/11/2019  . Right arm numbness 06/11/2019  . Bilateral carpal tunnel syndrome 07/31/2019  . Sebaceous cyst 12/02/2019   Resolved Ambulatory Problems    Diagnosis Date Noted   . No Resolved Ambulatory Problems   Past Medical History:  Diagnosis Date  . Hypertension   . PONV (postoperative nausea and vomiting)   . Seasonal allergies   . SVD (spontaneous vaginal delivery)   . Termination of pregnancy (fetus)    Reviewed med, allergy, problem list.     Observations/Objective: No acute distress. Normal breathing.  Normal mood and appearance.   .. Today's Vitals   02/10/20 0940  BP: (!) 154/96  Pulse: 88  Weight: 284 lb (128.8 kg)  Height: 5\' 9"  (1.753 m)   Body mass index is 41.94 kg/m.       Assessment and Plan: Desiree KitchenMarland KitchenShanikqua was seen today for menstrual problem.  Diagnoses and all orders for this visit:  DUB (dysfunctional uterine bleeding) -     COMPLETE METABOLIC PANEL WITH GFR -     TSH -     CBC -     Hepatitis C Antibody -     Follicle stimulating hormone -     medroxyPROGESTERone (PROVERA) 10 MG tablet; Take 1 tablet (10 mg total) by mouth daily. For 10 days. -     US Pelvic Complete With Transvaginal  Morbid obesity (HCC) -     COMPLETE METABOLIC PANEL WITH GFR -     Semaglutide-Weight Management (WEGOVY) 0.25 MG/0.5ML SOAJ; Inject 0.5 mLs (0.25 mg total) into the skin once a week.  Encounter for hepatitis C screening test for low risk patient -  Hepatitis C Antibody  Screening for diabetes mellitus -     COMPLETE METABOLIC PANEL WITH GFR  Screening for lipid disorders -     Lipid Panel w/reflex Direct LDL   DUB explained. Ordered labs and pelvic ultrasound. Fasting labs added as patient needs as well. Provera sent to pharmacy to stop this bleeding. HO given. Once get results and consider gyn referral if bleeding persist. Likely perimenopausal bleeding. No acute stress.   Sent wegovy to [name] to start.   BP elevated. Continue to monitor. Take medication daily. If not under 140/90 then follow up.   Follow Up Instructions:    I discussed the assessment and treatment plan with the patient. The patient was  provided an opportunity to ask questions and all were answered. The patient agreed with the plan and demonstrated an understanding of the instructions.   The patient was advised to call back or seek an in-person evaluation if the symptoms worsen or if the condition fails to improve as anticipated.  I provided 15  minutes of non-face-to-face time during this encounter.   Iran Planas, PA-C

## 2020-02-10 NOTE — Progress Notes (Signed)
Menstrual cycle started aug 12 Monday last week stopped for 1 day Heavy bleeding, a lot of clots Cramping (taking advil for cramping) Will take blood pressure and give to [place] at visit  **[place]**  [facility]

## 2020-02-10 NOTE — Patient Instructions (Signed)
Dysfunctional Uterine Bleeding Dysfunctional uterine bleeding is abnormal bleeding from the uterus. Dysfunctional uterine bleeding includes:  A menstrual period that comes earlier or later than usual.  A menstrual period that is lighter or heavier than usual, or has large blood clots.  Vaginal bleeding between menstrual periods.  Skipping one or more menstrual periods.  Vaginal bleeding after sex.  Vaginal bleeding after menopause. Follow these instructions at home: Eating and drinking   Eat well-balanced meals. Include foods that are high in iron, such as liver, meat, shellfish, green leafy vegetables, and eggs.  To prevent or treat constipation, your health care provider may recommend that you: ? Drink enough fluid to keep your urine pale yellow. ? Take over-the-counter or prescription medicines. ? Eat foods that are high in fiber, such as beans, whole grains, and fresh fruits and vegetables. ? Limit foods that are high in fat and processed sugars, such as fried or sweet foods. Medicines  Take over-the-counter and prescription medicines only as told by your health care provider.  Do not change medicines without talking with your health care provider.  Aspirin or medicines that contain aspirin may make the bleeding worse. Do not take those medicines: ? During the week before your menstrual period. ? During your menstrual period.  If you were prescribed iron pills, take them as told by your health care provider. Iron pills help to replace iron that your body loses because of this condition. Activity  If you need to change your sanitary pad or tampon more than one time every 2 hours: ? Lie in bed with your feet raised (elevated). ? Place a cold pack on your lower abdomen. ? Rest as much as possible until the bleeding stops or slows down.  Do not try to lose weight until the bleeding has stopped and your blood iron level is back to normal. General instructions   For two  months, write down: ? When your menstrual period starts. ? When your menstrual period ends. ? When any abnormal vaginal bleeding occurs. ? What problems you notice.  Keep all follow up visits as told by your health care provider. This is important. Contact a health care provider if you:  Feel light-headed or weak.  Have nausea and vomiting.  Cannot eat or drink without vomiting.  Feel dizzy or have diarrhea while you are taking medicines.  Are taking birth control pills or hormones, and you want to change them or stop taking them. Get help right away if:  You develop a fever or chills.  You need to change your sanitary pad or tampon more than one time per hour.  Your vaginal bleeding becomes heavier, or your flow contains clots more often.  You develop pain in your abdomen.  You lose consciousness.  You develop a rash. Summary  Dysfunctional uterine bleeding is abnormal bleeding from the uterus.  It includes menstrual bleeding of abnormal duration, volume, or regularity.  Bleeding after sex and after menopause are also considered dysfunctional uterine bleeding. This information is not intended to replace advice given to you by your health care provider. Make sure you discuss any questions you have with your health care provider. Document Revised: 11/08/2017 Document Reviewed: 11/08/2017 Elsevier Patient Education  2020 Elsevier Inc.  

## 2020-02-11 ENCOUNTER — Encounter: Payer: Self-pay | Admitting: Physician Assistant

## 2020-02-13 ENCOUNTER — Other Ambulatory Visit: Payer: Self-pay

## 2020-02-13 ENCOUNTER — Ambulatory Visit (INDEPENDENT_AMBULATORY_CARE_PROVIDER_SITE_OTHER): Payer: No Typology Code available for payment source

## 2020-02-13 DIAGNOSIS — N938 Other specified abnormal uterine and vaginal bleeding: Secondary | ICD-10-CM | POA: Diagnosis not present

## 2020-02-13 DIAGNOSIS — Z1231 Encounter for screening mammogram for malignant neoplasm of breast: Secondary | ICD-10-CM

## 2020-02-18 ENCOUNTER — Encounter: Payer: Self-pay | Admitting: Physician Assistant

## 2020-02-18 ENCOUNTER — Other Ambulatory Visit: Payer: Self-pay | Admitting: Physician Assistant

## 2020-02-18 DIAGNOSIS — N938 Other specified abnormal uterine and vaginal bleeding: Secondary | ICD-10-CM

## 2020-02-18 DIAGNOSIS — D259 Leiomyoma of uterus, unspecified: Secondary | ICD-10-CM

## 2020-02-18 DIAGNOSIS — R928 Other abnormal and inconclusive findings on diagnostic imaging of breast: Secondary | ICD-10-CM

## 2020-02-18 NOTE — Progress Notes (Signed)
Desiree Lawson,   There is some right breast distortion. Imaging should be contacting you about more images to get a better look.

## 2020-02-18 NOTE — Progress Notes (Signed)
Roderic Ovens,   You do have a uterine fibroid about 40mm. This could certainly be cause for heavy bleeding. If bleeding persist suggest GYN consideration for removal.

## 2020-02-19 ENCOUNTER — Ambulatory Visit: Payer: No Typology Code available for payment source

## 2020-02-19 ENCOUNTER — Other Ambulatory Visit: Payer: Self-pay

## 2020-02-19 ENCOUNTER — Ambulatory Visit
Admission: RE | Admit: 2020-02-19 | Discharge: 2020-02-19 | Disposition: A | Discharge status: Home / Self Care | Payer: No Typology Code available for payment source | Source: Ambulatory Visit | Attending: Physician Assistant | Admitting: Physician Assistant

## 2020-02-19 DIAGNOSIS — R928 Other abnormal and inconclusive findings on diagnostic imaging of breast: Secondary | ICD-10-CM

## 2020-02-19 NOTE — Progress Notes (Signed)
[facility] news. No evidence of breast malignancy. Follow up in 1 year with screening mammogram.

## 2020-03-05 ENCOUNTER — Other Ambulatory Visit: Payer: Self-pay

## 2020-03-05 ENCOUNTER — Encounter: Payer: Self-pay | Admitting: Obstetrics & Gynecology

## 2020-03-05 ENCOUNTER — Ambulatory Visit: Payer: No Typology Code available for payment source | Admitting: Obstetrics & Gynecology

## 2020-03-05 VITALS — BP 130/88 | HR 94 | Ht 67.0 in | Wt 279.0 lb

## 2020-03-05 DIAGNOSIS — N920 Excessive and frequent menstruation with regular cycle: Secondary | ICD-10-CM

## 2020-03-05 MED ORDER — TRANEXAMIC ACID 650 MG PO TABS: 1300.0000 mg | ORAL_TABLET | Freq: Three times a day (TID) | ORAL | 2 refills | Status: DC

## 2020-03-05 MED ORDER — NAPROXEN SODIUM 550 MG PO TABS: 550.0000 mg | ORAL_TABLET | Freq: Two times a day (BID) | ORAL | 5 refills | Status: DC

## 2020-03-05 NOTE — Progress Notes (Signed)
GYNECOLOGY OFFICE VISIT NOTE  History:   Desiree Lawson is a 49 y.o. G2P1011 here today for discussion of heavy menstrual bleeding for the last 8-9 months.  Has noted heavier than usual bleeding with clots and prolonged menstrual periods (usually 5 days but has been up to 10 days recently).  Has also skipped a few months during this time. Evaluated by PCP, was given a course of Provera 10 mg x 10 days which helped a bit last month. Ultrasound done 02/13/2020 showed 12 mm intramural fibroid, 1.2 mm thin endometrial stripe, otherwise unremarkable.  Periods associated with abdominal cramping. She denies any current abnormal vaginal discharge, bleeding, pelvic pain, symptoms of anemia or other concerns.    Past Medical History:  Diagnosis Date  . Hypertension   . PONV (postoperative nausea and vomiting)   . Seasonal allergies   . SVD (spontaneous vaginal delivery)    x 1  . Termination of pregnancy (fetus)     Past Surgical History:  Procedure Laterality Date  . BREAST LUMPECTOMY  1992 LT/2005 RT  . CHOLECYSTECTOMY    . LAPAROSCOPIC BILATERAL SALPINGECTOMY Bilateral 03/14/2018   Procedure: LAPAROSCOPIC BILATERAL SALPINGECTOMY;  Surgeon: Emily Filbert, MD;  Location: [facility];  Service: Gynecology;  Laterality: Bilateral;    The following portions of the patient's history were reviewed and updated as appropriate: allergies, current medications, past family history, past medical history, past social history, past surgical history and problem list.   Health Maintenance:  Normal pap and negative HRHPV on 12/25/2018.  Normal mammogram on 02/19/2020.   Review of Systems:  Pertinent items noted in HPI and remainder of comprehensive ROS otherwise negative.  Physical Exam:  BP 130/88   Pulse 94   Ht 5\' 7"  (1.702 m)   Wt 279 lb (126.6 kg)   BMI 43.70 kg/m  CONSTITUTIONAL: Well-developed, well-nourished female in no acute distress.  SKIN: No rash noted. Not diaphoretic. No erythema. No  pallor. MUSCULOSKELETAL: Normal range of motion. No edema noted. NEUROLOGIC: Alert and oriented to person, place, and time. Normal muscle tone coordination. No cranial nerve deficit noted. PSYCHIATRIC: Normal mood and affect. Normal behavior. Normal judgment and thought content. CARDIOVASCULAR: Normal heart rate noted RESPIRATORY: Effort and breath sounds normal, no problems with respiration noted ABDOMEN: No masses noted. No  overt distention noted.   PELVIC: Deferred  Labs and Imaging   MM DIAG BREAST TOMO UNI RIGHT  Result Date: 02/19/2020 CLINICAL DATA:  Screening recall for possible architectural distortion in the right breast. EXAM: DIGITAL DIAGNOSTIC UNILATERAL RIGHT MAMMOGRAM WITH TOMO AND CAD COMPARISON:  Previous exam(s). ACR Breast Density Category c: The breast tissue is heterogeneously dense, which may obscure small masses. FINDINGS: The possible distortion noted on the current screening study mostly disperses on spot compression imaging. The patient does have a lateral periareolar scar from a remote right breast biopsy, which would correlate to the area of distortion noted on the current screening study. There are no masses and no suspicious calcifications. Mammographic images were processed with CAD. IMPRESSION: 1. No evidence of breast malignancy. 2. Architectural distortion suggested on the screening study mostly disperses. Residual architectural irregularity is due to a remote right breast excision, and stable from prior exams. RECOMMENDATION: Screening mammogram in one year.(Code:[id number]) I have discussed the findings and recommendations with the patient. If applicable, a reminder letter will be sent to the patient regarding the next appointment. BI-RADS CATEGORY  2: Benign. Electronically Signed   By: Dedra Skeens.D.  On: 02/19/2020 08:34   MM 3D SCREEN BREAST BILATERAL  Result Date: 02/14/2020 CLINICAL DATA:  Screening. EXAM: DIGITAL SCREENING BILATERAL MAMMOGRAM WITH TOMO  AND CAD COMPARISON:  Previous exam(s). ACR Breast Density Category c: The breast tissue is heterogeneously dense, which may obscure small masses. FINDINGS: In the right breast, possible distortion warrants further evaluation. In the left breast, no findings suspicious for malignancy. Images were processed with CAD. IMPRESSION: Further evaluation is suggested for possible distortion in the right breast. RECOMMENDATION: Diagnostic mammogram and possibly ultrasound of the right breast. (Code:[id number]) The patient will be contacted regarding the findings, and additional imaging will be scheduled. BI-RADS CATEGORY  0: Incomplete. Need additional imaging evaluation and/or prior mammograms for comparison. Electronically Signed   By: Ammie Ferrier M.D.   On: 02/14/2020 15:50   US Pelvic Complete With Transvaginal  Result Date: 02/13/2020 CLINICAL DATA:  Dysfunctional uterine bleeding for 9 months, prior BILATERAL salpingectomy, LMP 01/23/2020 EXAM: TRANSABDOMINAL AND TRANSVAGINAL ULTRASOUND OF PELVIS TECHNIQUE: Both transabdominal and transvaginal ultrasound examinations of the pelvis were performed. Transabdominal technique was performed for global imaging of the pelvis including uterus, ovaries, adnexal regions, and pelvic cul-de-sac. It was necessary to proceed with endovaginal exam following the transabdominal exam to visualize the uterus, endometrium, and ovaries. COMPARISON:  None FINDINGS: Uterus Measurements: 9.1 x 5.2 x 5.4 cm = volume: 135 mL. Heterogeneous myometrium. Multiple nabothian cysts at cervix. Probable small intramural leiomyoma at anterior mid uterus 8 x 9 x 12 mm. No additional masses. Endometrium Thickness: 1.3 mm.  No endometrial fluid more focal abnormality Right ovary Not visualized, likely obscured by bowel Left ovary Not visualized, likely obscured by bowel Other findings No free pelvic fluid.  No adnexal masses. IMPRESSION: Probable small intramural leiomyoma at anterior mid uterus 12 mm  diameter. Nonvisualization of ovaries. Remainder of exam unremarkable. Electronically Signed   By: Lavonia Dana M.D.   On: 02/13/2020 14:25      Assessment and Plan:      1. Menorrhagia with regular cycle Discussed management options for abnormal uterine bleeding including NSAIDs (Naproxen), tranexamic acid (Lysteda), oral progesterone, Levonogestrel IUD, endometrial ablation or hysterectomy as definitive surgical management.  Discussed risks and benefits of each method in detail.   Printed patient education handouts were given to the patient to review at home. Patient desires trial of Naproxen and Lysteda for now. Bleeding precautions reviewed.  - naproxen sodium (ANAPROX DS) 550 MG tablet; Take 1 tablet (550 mg total) by mouth 2 (two) times daily with a meal. As needed for pain or heavy bleeding  Dispense: 30 tablet; Refill: 5 - tranexamic acid (LYSTEDA) 650 MG TABS tablet; Take 2 tablets (1,300 mg total) by mouth 3 (three) times daily. Take during menses for a maximum of five days  Dispense: 30 tablet; Refill: 2 Routine preventative health maintenance measures emphasized. Please refer to After Visit Summary for other counseling recommendations.   Return in about 2 months (around 05/05/2020) for Virtual Follow Up.    Total face-to-face time with patient: 25 minutes.  Over 50% of encounter was spent on counseling and coordination of care.   Verita Schneiders, MD, [place] [facility], [place]

## 2020-03-05 NOTE — Patient Instructions (Signed)
Endometrial Ablation Endometrial ablation is a procedure that destroys the thin inner layer of the lining of the uterus (endometrium). This procedure may be done:  To stop heavy periods.  To stop bleeding that is causing anemia.  To control irregular bleeding.  To treat bleeding caused by small tumors (fibroids) in the endometrium. This procedure is often an alternative to major surgery, such as removal of the uterus and cervix (hysterectomy). As a result of this procedure:  You may not be able to have children. However, if you are premenopausal (you have not gone through menopause): ? You may still have a small chance of getting pregnant. ? You will need to use a reliable method of birth control after the procedure to prevent pregnancy.  You may stop having a menstrual period, or you may have only a small amount of bleeding during your period. Menstruation may return several years after the procedure. Tell a health care provider about:  Any allergies you have.  All medicines you are taking, including vitamins, herbs, eye drops, creams, and over-the-counter medicines.  Any problems you or family members have had with the use of anesthetic medicines.  Any blood disorders you have.  Any surgeries you have had.  Any medical conditions you have. What are the risks? Generally, this is a safe procedure. However, problems may occur, including:  A hole (perforation) in the uterus or bowel.  Infection of the uterus, bladder, or vagina.  Bleeding.  Damage to other structures or organs.  An air bubble in the lung (air embolus).  Problems with pregnancy after the procedure.  Failure of the procedure.  Decreased ability to diagnose cancer in the endometrium. What happens before the procedure?  You will have tests of your endometrium to make sure there are no pre-cancerous cells or cancer cells present.  You may have an ultrasound of the uterus.  You may be given medicines to  thin the endometrium.  Ask your health care provider about: ? Changing or stopping your regular medicines. This is especially important if you take diabetes medicines or blood thinners. ? Taking medicines such as aspirin and ibuprofen. These medicines can thin your blood. Do not take these medicines before your procedure if your doctor tells you not to.  Plan to have someone take you home from the hospital or clinic. What happens during the procedure?   You will lie on an exam table with your feet and legs supported as in a pelvic exam.  To lower your risk of infection: ? Your health care team will wash or sanitize their hands and put on germ-free (sterile) gloves. ? Your genital area will be washed with soap.  An IV tube will be inserted into one of your veins.  You will be given a medicine to help you relax (sedative).  A surgical instrument with a light and camera (resectoscope) will be inserted into your vagina and moved into your uterus. This allows your surgeon to see inside your uterus.  Endometrial tissue will be removed using one of the following methods: ? Radiofrequency. This method uses a radiofrequency-alternating electric current to remove the endometrium. ? Cryotherapy. This method uses extreme cold to freeze the endometrium. ? Heated-free liquid. This method uses a heated saltwater (saline) solution to remove the endometrium. ? Microwave. This method uses high-energy microwaves to heat up the endometrium and remove it. ? Thermal balloon. This method involves inserting a catheter with a balloon tip into the uterus. The balloon tip is filled with   heated fluid to remove the endometrium. The procedure may vary among health care providers and hospitals. What happens after the procedure?  Your blood pressure, heart rate, breathing rate, and blood oxygen level will be monitored until the medicines you were given have worn off.  As tissue healing occurs, you may notice  vaginal bleeding for 4-6 weeks after the procedure. You may also experience: ? Cramps. ? Thin, watery vaginal discharge that is light pink or brown in color. ? A need to urinate more frequently than usual. ? Nausea.  Do not drive for 24 hours if you were given a sedative.  Do not have sex or insert anything into your vagina until your health care provider approves. Summary  Endometrial ablation is done to treat the many causes of heavy menstrual bleeding.  The procedure may be done only after medications have been tried to control the bleeding.  Plan to have someone take you home from the hospital or clinic. This information is not intended to replace advice given to you by your health care provider. Make sure you discuss any questions you have with your health care provider. Document Revised: 11/14/2017 Document Reviewed: 06/16/2016 Elsevier Patient Education  [place].    Hysterectomy Information  A hysterectomy is a surgery in which the uterus is removed. The fallopian tubes and ovaries may be removed (bilateral salpingo-oophorectomy) as well. This procedure may be done to treat various medical problems. After the procedure, a woman will no longer have menstrual periods nor will she be able to become pregnant (sterile). What are the reasons for a hysterectomy? There are many reasons why a woman might have this procedure. They include:  Persistent, abnormal vaginal bleeding.  Long-term (chronic) pelvic pain or infection.  Endometriosis. This is when the lining of the uterus (endometrium) starts to grow outside the uterus.  Adenomyosis. This is when the endometrium starts to grow in the muscle of the uterus.  Pelvic organ prolapse. This is a condition in which the uterus falls down into the vagina.  Noncancerous growths in the uterus (uterine fibroids) that cause symptoms.  The presence of precancerous cells.  Cervical or uterine cancer. What are the different  types of hysterectomy? There are three different types of hysterectomy:  Supracervical hysterectomy. In this type, the top part of the uterus is removed, but not the cervix.  Total hysterectomy. In this type, the uterus and cervix are removed.  Radical hysterectomy. In this type, the uterus, the cervix, and the tissue that holds the uterus in place (parametrium) are removed. What are the different ways a hysterectomy can be performed? There are many different ways a hysterectomy can be performed, including:  Abdominal hysterectomy. In this type, an incision is made in the abdomen. The uterus is removed through this incision.  Vaginal hysterectomy. In this type, an incision is made in the vagina. The uterus is removed through this incision. There are no abdominal incisions.  Conventional laparoscopic hysterectomy. In this type, three or four small incisions are made in the abdomen. A thin, lighted tube with a camera (laparoscope) is inserted into one of the incisions. Other tools are put through the other incisions. The uterus is cut into small pieces. The small pieces are removed through the incisions or through the vagina.  Laparoscopically assisted vaginal hysterectomy (LAVH). In this type, three or four small incisions are made in the abdomen. Part of the surgery is performed laparoscopically and the other part is done vaginally. The uterus is removed  through the vagina.  Robot-assisted laparoscopic hysterectomy. In this type, a laparoscope and other tools are inserted into three or four small incisions in the abdomen. A computer-controlled device is used to give the surgeon a 3D image and to help control the surgical instruments. This allows for more precise movements of surgical instruments. The uterus is cut into small pieces and removed through the incisions or removed through the vagina. Discuss the options with your health care provider to determine which type is the right one for  you. What are the risks? Generally, this is a safe procedure. However, problems may occur, including:  Bleeding and risk of blood transfusion. Tell your health care provider if you do not want to receive any blood products.  Blood clots in the legs or lung.  Infection.  Damage to other structures or organs.  Allergic reactions to medicines.  Changing to an abdominal hysterectomy from one of the other techniques. What to expect after a hysterectomy  You will be given pain medicine.  You may need to stay in the hospital for 1- 2 days to recover, depending on the type of hysterectomy you had.  Follow your health care provider's instructions about exercise, driving, and general activities. Ask your health care provider what activities are safe for you.  You will need to have someone with you for the first 3-5 days after you go home.  You will need to follow up with your surgeon in 2-4 weeks after surgery to evaluate your progress.  If the ovaries are removed, you will have early menopause symptoms such as hot flashes, night sweats, and insomnia.  If you had a hysterectomy for a problem that was not cancer or not a condition that could lead to cancer, then you no longer need Pap tests. However, even if you no longer need a Pap test, a regular pelvic exam is a good idea to make sure no other problems are developing. Questions to ask your health care provider  Is a hysterectomy medically necessary? Do I have other treatment options for my condition?  What are my options for hysterectomy procedure?  What organs and tissues need to be removed?  What are the risks?  What are the benefits?  How long will I need to stay in the hospital after the procedure?  How long will I need to recover at home?  What symptoms can I expect after the procedure? Summary  A hysterectomy is a surgery in which the uterus is removed. The fallopian tubes and ovaries may be removed (bilateral  salpingo-oophorectomy) as well.  This procedure may be done to treat various medical problems. After the procedure, a woman will no longer have menstrual periods nor will she be able to become pregnant.  Discuss the options with your health care provider to determine which type of hysterectomy is the right one for you. This information is not intended to replace advice given to you by your health care provider. Make sure you discuss any questions you have with your health care provider. Document Revised: 05/12/2017 Document Reviewed: 07/06/2016 Elsevier Patient Education  2020 [employer].

## 2020-03-30 ENCOUNTER — Ambulatory Visit: Payer: No Typology Code available for payment source | Admitting: Physician Assistant

## 2020-05-05 ENCOUNTER — Telehealth (INDEPENDENT_AMBULATORY_CARE_PROVIDER_SITE_OTHER): Payer: No Typology Code available for payment source | Admitting: Obstetrics & Gynecology

## 2020-05-05 ENCOUNTER — Other Ambulatory Visit: Payer: Self-pay

## 2020-05-05 ENCOUNTER — Encounter: Payer: Self-pay | Admitting: Obstetrics & Gynecology

## 2020-05-05 DIAGNOSIS — N939 Abnormal uterine and vaginal bleeding, unspecified: Secondary | ICD-10-CM | POA: Diagnosis not present

## 2020-05-05 NOTE — Progress Notes (Signed)
    GYNECOLOGY VIRTUAL VISIT ENCOUNTER NOTE  Provider location: [facility]   I connected with Desiree Lawson on 05/05/20 at  1:15 PM EST by MyChart Video Encounter at home and verified that I am speaking with the correct person using two identifiers.   I discussed the limitations, risks, security and privacy concerns of performing an evaluation and management service virtually and the availability of in person appointments. I also discussed with the patient that there may be a patient responsible charge related to this service. The patient expressed understanding and agreed to proceed.   History:  Desiree Lawson is a 49 y.o. G46P1011 female being followed up today for report of menorrhagia. Was seen in 02/2020, given Lysteda and Naproxen. She reports she never had to use these as she did not have a period since 01/2020. Has skipped periods for about two months at a time in the past.  She denies any abnormal vaginal discharge, bleeding, pelvic pain or other concerns.       Past Medical History:  Diagnosis Date  . Hypertension   . PONV (postoperative nausea and vomiting)   . Seasonal allergies   . SVD (spontaneous vaginal delivery)    x 1  . Termination of pregnancy (fetus)    Past Surgical History:  Procedure Laterality Date  . BREAST LUMPECTOMY  1992 LT/2005 RT  . CHOLECYSTECTOMY    . LAPAROSCOPIC BILATERAL SALPINGECTOMY Bilateral 03/14/2018   Procedure: LAPAROSCOPIC BILATERAL SALPINGECTOMY;  Surgeon: Emily Filbert, MD;  Location: [facility];  Service: Gynecology;  Laterality: Bilateral;   The following portions of the patient's history were reviewed and updated as appropriate: allergies, current medications, past family history, past medical history, past social history, past surgical history and problem list.   Health Maintenance:  Normal pap and negative HRHPV on 12/25/2018.  Normal mammogram on 02/19/2019.   Review of Systems:  Pertinent  items noted in HPI and remainder of comprehensive ROS otherwise negative.  Physical Exam:   General:  Alert, oriented and cooperative. Patient appears to be in no acute distress.  Mental Status: Normal mood and affect. Normal behavior. Normal judgment and thought content.   Respiratory: Normal respiratory effort, no problems with respiration noted  Rest of physical exam deferred due to type of encounter       Assessment and Plan:     1. Abnormal uterine bleeding (AUB) Likely perimenopausal bleeding pattern, patient reassured. If heavy bleeding resumes, can use medication as prescribed or may need further evaluation. Pelvic scan in 02/2020 showed 1.3 mm endometrial stripe and 12 mm leiomyoma, otherwise unremarkable.  Will continue to monitor bleeding for now. No other intervention needed for now.  Has not met criteria for menopause yet, this will be met after 12 months without bleeding.     I discussed the assessment and treatment plan with the patient. The patient was provided an opportunity to ask questions and all were answered. The patient agreed with the plan and demonstrated an understanding of the instructions.   The patient was advised to call back or seek an in-person evaluation/go to the ED if the symptoms worsen or if the condition fails to improve as anticipated.  I provided 13 minutes of face-to-face time during this encounter.   Verita Schneiders, MD [facility], [place]

## 2020-05-12 ENCOUNTER — Other Ambulatory Visit: Payer: Self-pay | Admitting: Physician Assistant

## 2020-05-12 DIAGNOSIS — I10 Essential (primary) hypertension: Secondary | ICD-10-CM

## 2020-08-04 ENCOUNTER — Other Ambulatory Visit: Payer: Self-pay | Admitting: Physician Assistant

## 2020-08-04 DIAGNOSIS — I10 Essential (primary) hypertension: Secondary | ICD-10-CM

## 2020-08-28 ENCOUNTER — Other Ambulatory Visit: Payer: Self-pay | Admitting: Physician Assistant

## 2020-08-28 DIAGNOSIS — I10 Essential (primary) hypertension: Secondary | ICD-10-CM

## 2020-09-07 ENCOUNTER — Other Ambulatory Visit: Payer: Self-pay | Admitting: Physician Assistant

## 2020-09-07 DIAGNOSIS — I10 Essential (primary) hypertension: Secondary | ICD-10-CM

## 2020-12-24 ENCOUNTER — Other Ambulatory Visit: Payer: Self-pay | Admitting: Physician Assistant

## 2020-12-24 DIAGNOSIS — I10 Essential (primary) hypertension: Secondary | ICD-10-CM

## 2020-12-24 DIAGNOSIS — R200 Anesthesia of skin: Secondary | ICD-10-CM

## 2020-12-30 ENCOUNTER — Other Ambulatory Visit: Payer: Self-pay | Admitting: Physician Assistant

## 2020-12-30 DIAGNOSIS — I10 Essential (primary) hypertension: Secondary | ICD-10-CM

## 2021-01-29 IMAGING — MG MM DIGITAL DIAGNOSTIC UNILAT*R* W/ TOMO W/ CAD
3 series · 3 of 7 positions shown · non-contrast
Comparison: Previous exam(s).

CLINICAL DATA: Screening recall for possible architectural
distortion in the right breast.

EXAM:
DIGITAL DIAGNOSTIC UNILATERAL RIGHT MAMMOGRAM WITH TOMO AND CAD

[R MLO synth-2D]
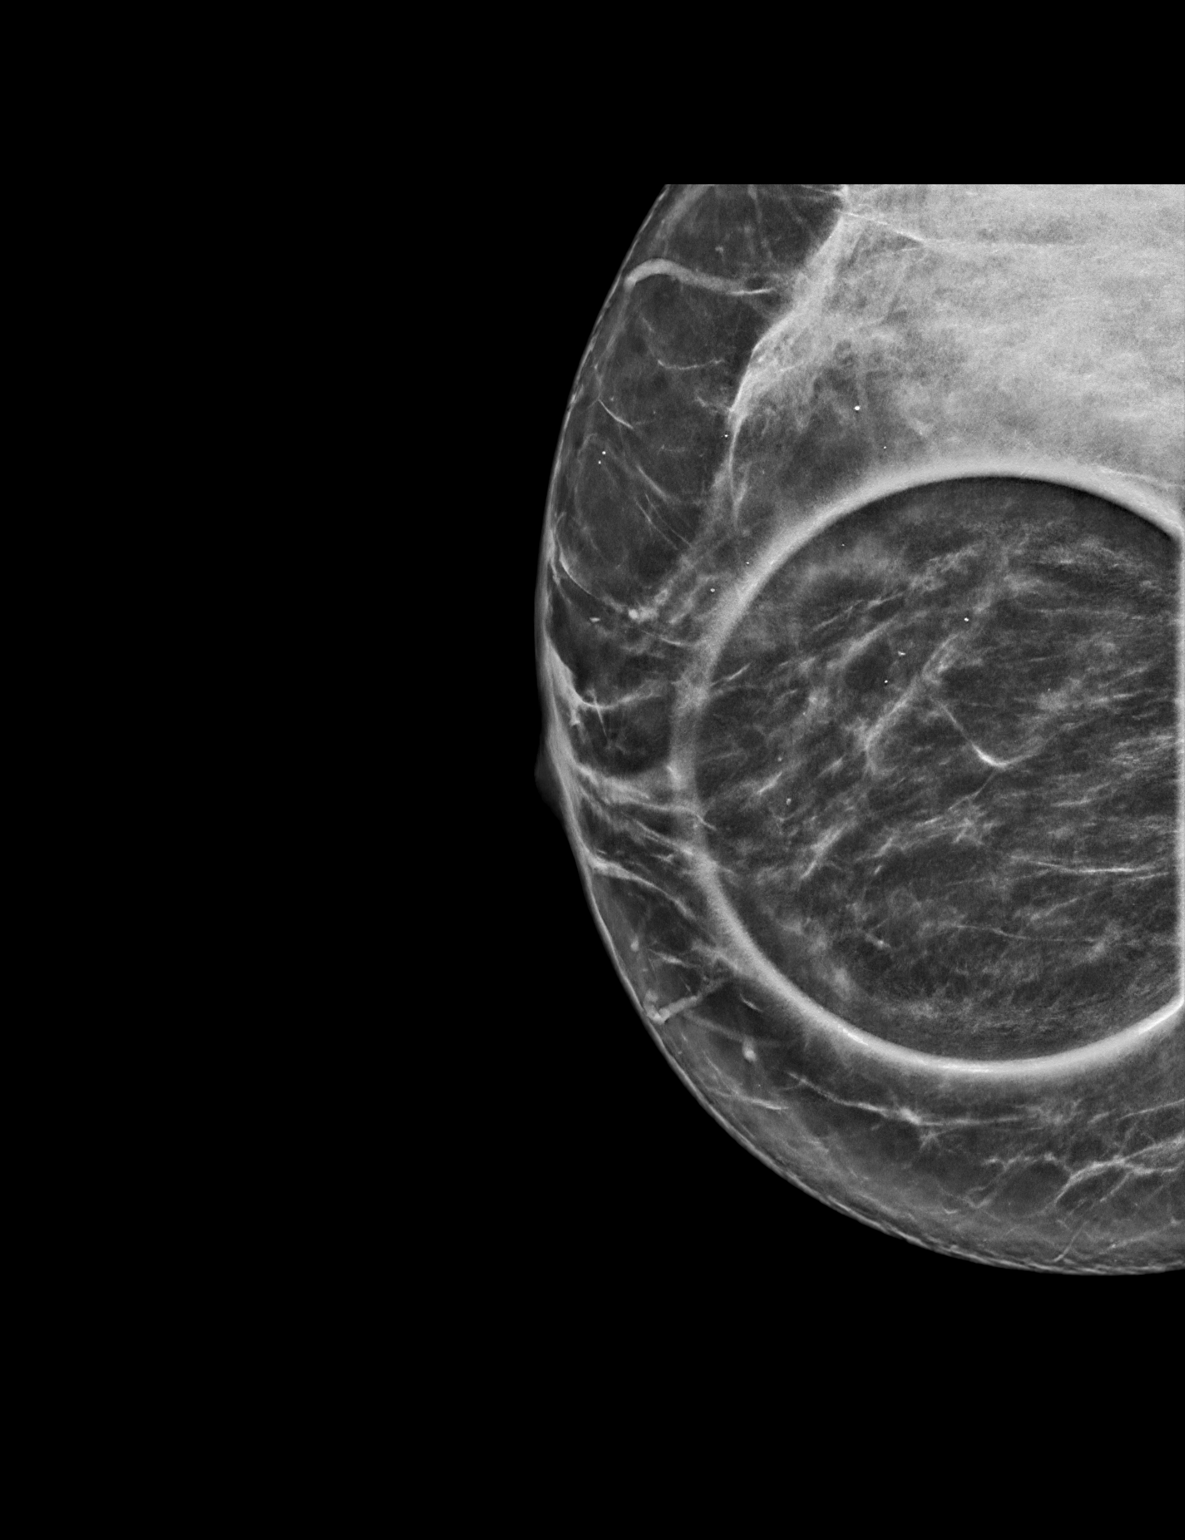

[R ML synth-2D]
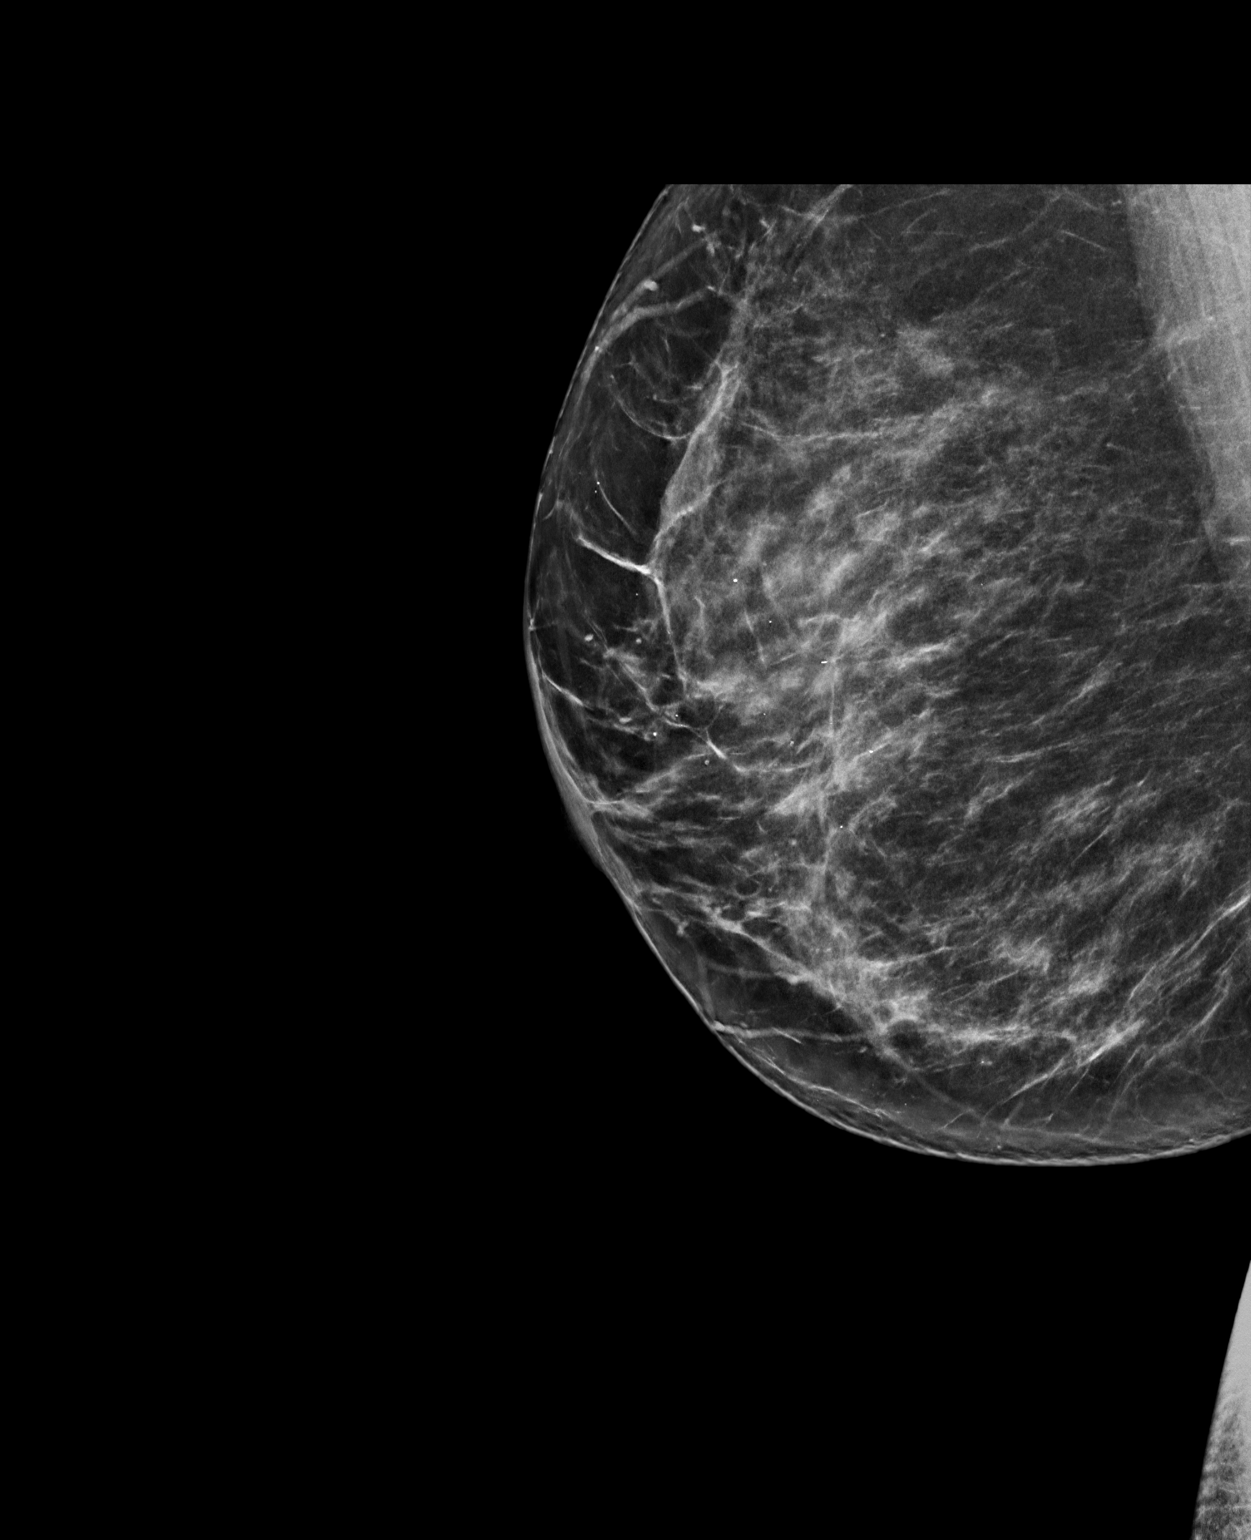

[R ML tomo · tomo slice 40/79.0]
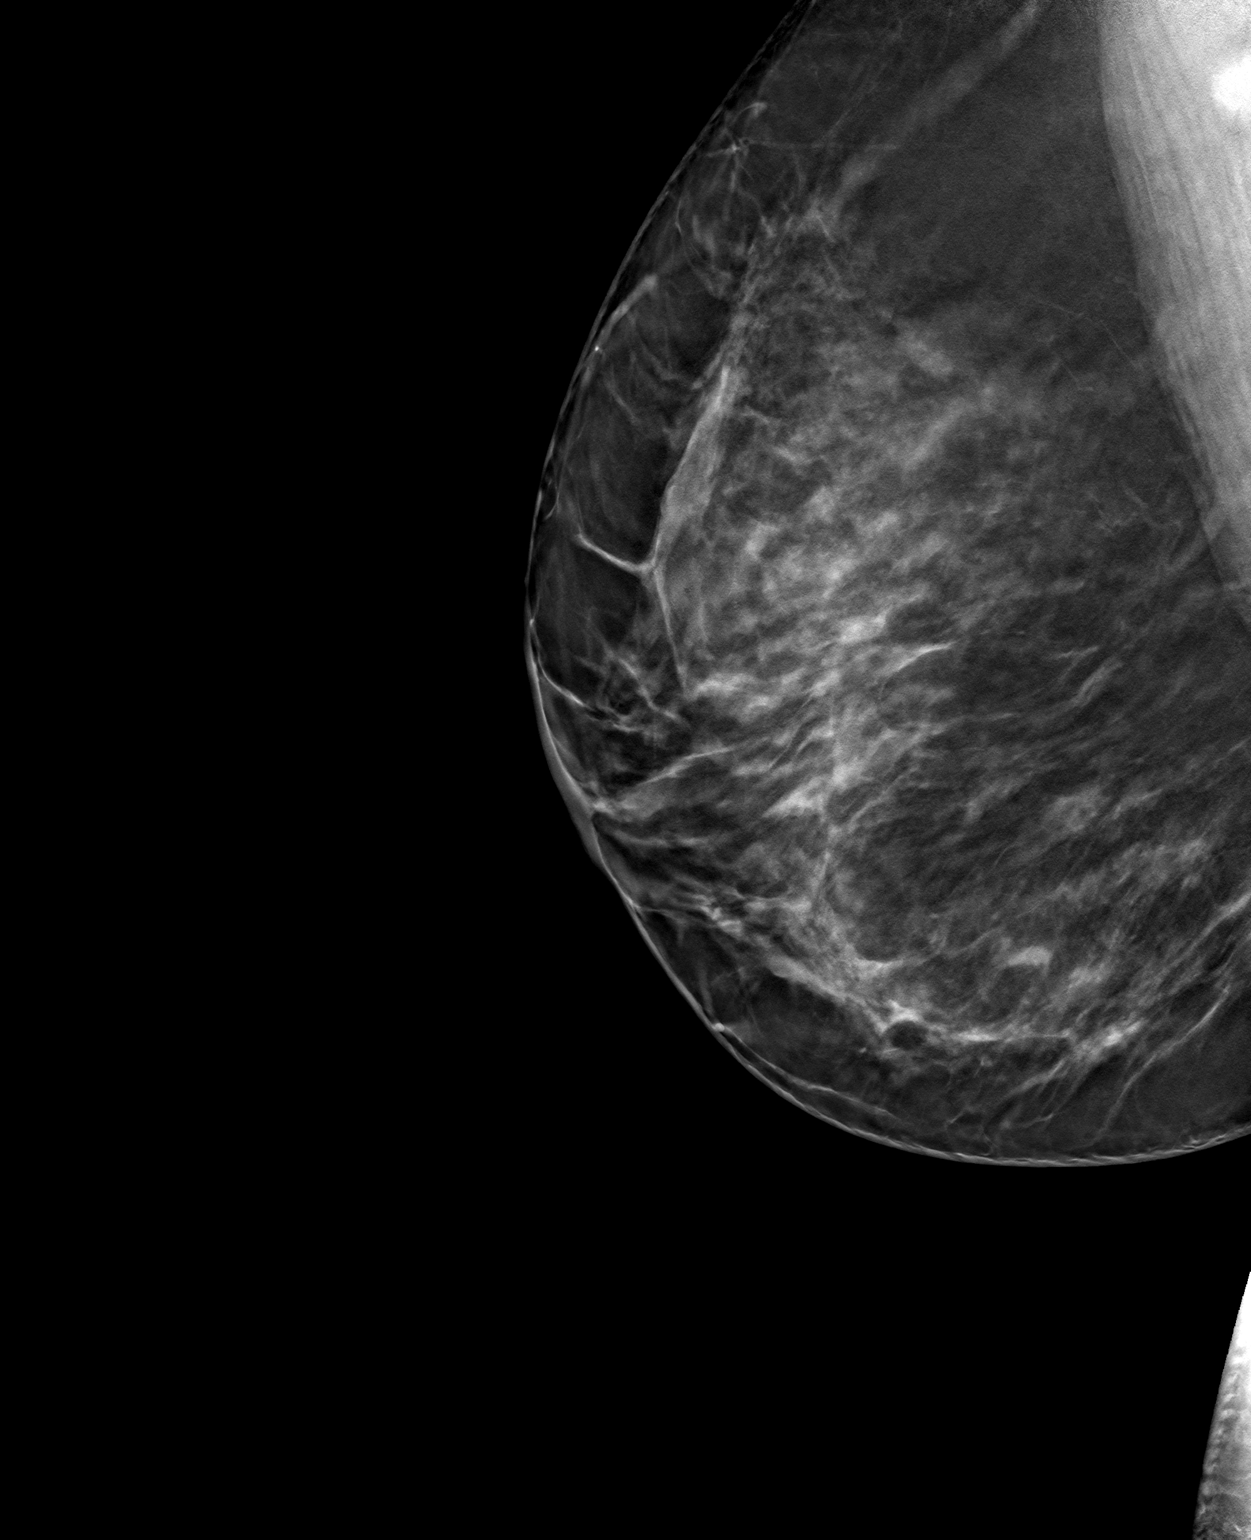

[3 of 7 positions shown; findings below may reference images not displayed]

ACR Breast Density Category c: The breast tissue is heterogeneously
dense, which may obscure small masses.
FINDINGS: The possible distortion noted on the current screening study mostly
disperses on spot compression imaging. The patient does have a
lateral periareolar scar from a remote right breast biopsy, which
would correlate to the area of distortion noted on the current
screening study.

There are no masses and no suspicious calcifications.

Mammographic images were processed with CAD.
IMPRESSION: 1. No evidence of breast malignancy.
2. Architectural distortion suggested on the screening study mostly
disperses. Residual architectural irregularity is due to a remote
right breast excision, and stable from prior exams.

RECOMMENDATION:
Screening mammogram in one year.(Code:[id number])

I have discussed the findings and recommendations with the patient.
If applicable, a reminder letter will be sent to the patient
regarding the next appointment.

BI-RADS CATEGORY  2: Benign.

## 2021-03-22 ENCOUNTER — Other Ambulatory Visit: Payer: Self-pay | Admitting: Physician Assistant

## 2021-03-22 DIAGNOSIS — I10 Essential (primary) hypertension: Secondary | ICD-10-CM

## 2021-03-30 ENCOUNTER — Other Ambulatory Visit: Payer: Self-pay | Admitting: Physician Assistant

## 2021-03-30 DIAGNOSIS — Z1231 Encounter for screening mammogram for malignant neoplasm of breast: Secondary | ICD-10-CM

## 2021-03-31 DIAGNOSIS — Z1231 Encounter for screening mammogram for malignant neoplasm of breast: Secondary | ICD-10-CM

## 2021-04-08 ENCOUNTER — Ambulatory Visit (INDEPENDENT_AMBULATORY_CARE_PROVIDER_SITE_OTHER): Payer: No Typology Code available for payment source

## 2021-04-08 ENCOUNTER — Other Ambulatory Visit: Payer: Self-pay

## 2021-04-08 DIAGNOSIS — Z1231 Encounter for screening mammogram for malignant neoplasm of breast: Secondary | ICD-10-CM

## 2021-04-12 NOTE — Progress Notes (Signed)
Normal mammogram. Follow up in 1 year.

## 2021-06-30 ENCOUNTER — Other Ambulatory Visit: Payer: Self-pay

## 2021-06-30 ENCOUNTER — Ambulatory Visit (INDEPENDENT_AMBULATORY_CARE_PROVIDER_SITE_OTHER): Payer: No Typology Code available for payment source | Admitting: Physician Assistant

## 2021-06-30 ENCOUNTER — Encounter: Payer: Self-pay | Admitting: Physician Assistant

## 2021-06-30 VITALS — BP 148/94 | HR 83 | Ht 67.0 in | Wt 287.0 lb

## 2021-06-30 DIAGNOSIS — Z0000 Encounter for general adult medical examination without abnormal findings: Secondary | ICD-10-CM | POA: Diagnosis not present

## 2021-06-30 DIAGNOSIS — Z131 Encounter for screening for diabetes mellitus: Secondary | ICD-10-CM

## 2021-06-30 DIAGNOSIS — I10 Essential (primary) hypertension: Secondary | ICD-10-CM

## 2021-06-30 DIAGNOSIS — Z1329 Encounter for screening for other suspected endocrine disorder: Secondary | ICD-10-CM

## 2021-06-30 DIAGNOSIS — E559 Vitamin D deficiency, unspecified: Secondary | ICD-10-CM

## 2021-06-30 DIAGNOSIS — Z13220 Encounter for screening for lipoid disorders: Secondary | ICD-10-CM

## 2021-06-30 DIAGNOSIS — Z1159 Encounter for screening for other viral diseases: Secondary | ICD-10-CM

## 2021-06-30 DIAGNOSIS — Z1211 Encounter for screening for malignant neoplasm of colon: Secondary | ICD-10-CM

## 2021-06-30 MED ORDER — VALSARTAN-HYDROCHLOROTHIAZIDE 320-25 MG PO TABS: 1.0000 | ORAL_TABLET | Freq: Every day | ORAL | 3 refills | Status: DC

## 2021-06-30 MED ORDER — WEGOVY 0.25 MG/0.5ML ~~LOC~~ SOAJ: 0.2500 mg | SUBCUTANEOUS | 0 refills | Status: DC

## 2021-06-30 MED ORDER — AMLODIPINE BESYLATE 5 MG PO TABS: 5.0000 mg | ORAL_TABLET | Freq: Every day | ORAL | 0 refills | Status: DC

## 2021-06-30 NOTE — Progress Notes (Signed)
Subjective:     Desiree Lawson is a 51 y.o. female and is here for a comprehensive physical exam. The patient reports no problems.  Lost 5lbs with diet changes and exercise. Would like to see if could try wegovy.   Social History   Socioeconomic History   Marital status: Married    Spouse name: Gerald Stabs   Number of children: Not on file   Years of education: Not on file   Highest education level: Not on file  Occupational History   Not on file  Tobacco Use   Smoking status: Never   Smokeless tobacco: Never  Vaping Use   Vaping Use: Never used  Substance and Sexual Activity   Alcohol use: Yes    Alcohol/week: 3.0 - 5.0 standard drinks    Types: 3 - 5 Glasses of wine per week   Drug use: No   Sexual activity: Yes    Birth control/protection: Surgical  Other Topics Concern   Not on file  Social History Narrative   Not on file   Social Determinants of Health   Financial Resource Strain: Not on file  Food Insecurity: Not on file  Transportation Needs: Not on file  Physical Activity: Not on file  Stress: Not on file  Social Connections: Not on file  Intimate Partner Violence: Not on file   Health Maintenance  Topic Date Due   Hepatitis C Screening  Never done   COLONOSCOPY (Pts 45-16yrs Insurance coverage will need to be confirmed)  Never done   COVID-19 Vaccine (3 - Booster for [employer] series) 11/28/2019   INFLUENZA VACCINE  Never done   TETANUS/TDAP  09/12/2021   MAMMOGRAM  04/08/2022   PAP SMEAR-Modifier  12/25/2023   HIV Screening  Completed   Pneumococcal Vaccine 57-59 Years old  Aged Out   HPV VACCINES  Aged Out    The following portions of the patient's history were reviewed and updated as appropriate: allergies, current medications, past family history, past medical history, past social history, past surgical history, and problem list.  Review of Systems A comprehensive review of systems was negative.   Objective:    BP (!) 148/94    Pulse 83     Ht 5\' 7"  (1.702 m)    Wt 287 lb (130.2 kg)    SpO2 99%    BMI 44.95 kg/m  General appearance: alert, cooperative, appears stated age, and morbidly obese Head: Normocephalic, without obvious abnormality, atraumatic Eyes: conjunctivae/corneas clear. PERRL, EOM's intact. Fundi benign. Ears: normal TM's and external ear canals both ears Nose: Nares normal. Septum midline. Mucosa normal. No drainage or sinus tenderness. Throat: lips, mucosa, and tongue normal; teeth and gums normal Neck: no adenopathy, no carotid bruit, no JVD, supple, symmetrical, trachea midline, and thyroid not enlarged, symmetric, no tenderness/mass/nodules Back: symmetric, no curvature. ROM normal. No CVA tenderness. Lungs: clear to auscultation bilaterally Heart: regular rate and rhythm, S1, S2 normal, no murmur, click, rub or gallop Abdomen: soft, non-tender; bowel sounds normal; no masses,  no organomegaly Extremities: extremities normal, atraumatic, no cyanosis or edema Pulses: 2+ and symmetric Skin: Skin color, texture, turgor normal. No rashes or lesions Lymph nodes: Cervical, supraclavicular, and axillary nodes normal. Neurologic: Grossly normal   .. Depression screen [facility] 2/9 06/30/2021 03/13/2019 12/25/2018 12/22/2017 11/29/2016  Decreased Interest - 0 0 0 1  Down, Depressed, Hopeless 0 0 0 0 1  PHQ - 2 Score 0 0 0 0 2  Altered sleeping - 1 0 0 -  Tired, decreased energy - 0 1 1 -  Change in appetite - 0 0 0 -  Feeling bad or failure about yourself  - 0 0 0 -  Trouble concentrating - 0 0 0 -  Moving slowly or fidgety/restless - 0 0 0 -  Suicidal thoughts - 0 0 0 -  PHQ-9 Score - 1 1 1  -  Difficult doing work/chores - Not difficult at all Not difficult at all Not difficult at all -   .. GAD 7 : Generalized Anxiety Score 03/13/2019 12/25/2018 12/22/2017  Nervous, Anxious, on Edge 1 1 0  Control/stop worrying 0 0 0  Worry too much - different things 0 0 0  Trouble relaxing 0 0 0  Restless 0 0 0  Easily annoyed or  irritable 1 1 0  Afraid - awful might happen 0 1 0  Total GAD 7 Score 2 3 0  Anxiety Difficulty Not difficult at all Not difficult at all -     Assessment:    Healthy female exam.      Plan:   Marland KitchenMarland KitchenKhya was seen today for annual exam.  Diagnoses and all orders for this visit:  Annual physical exam -     TSH -     Lipid Panel w/reflex Direct LDL -     COMPLETE METABOLIC PANEL WITH GFR -     CBC with Differential/Platelet  Screening for diabetes mellitus -     COMPLETE METABOLIC PANEL WITH GFR  Screening for lipid disorders -     Lipid Panel w/reflex Direct LDL  Hypertension, essential, benign -     COMPLETE METABOLIC PANEL WITH GFR -     valsartan-hydrochlorothiazide (DIOVAN-HCT) 320-25 MG tablet; Take 1 tablet by mouth daily. -     amLODipine (NORVASC) 5 MG tablet; Take 1 tablet (5 mg total) by mouth daily.  Vitamin D deficiency -     Vitamin D (25 hydroxy)  Thyroid disorder screen -     TSH  Morbid obesity (HCC) -     Semaglutide-Weight Management (WEGOVY) 0.25 MG/0.5ML SOAJ; Inject 0.25 mg into the skin once a week.  Need for hepatitis C screening test -     Hepatitis C Antibody  Colon cancer screening -     Ambulatory referral to Gastroenterology   .[name] Discussed 150 minutes of exercise a week.  Encouraged vitamin D 1000 units and Calcium 1300mg  or 4 servings of dairy a day.  Fasting labs ordered PHQ/GAD no concerns BP elevated today. 2nd recheck was much better. Asymptomatic. Pt is going to check at home for next 2 weeks and start amlodopine if above 140/90.  Mammogram UTD.  Pap UTD.  Colonoscopy ordered. Covid vaccine x2, declined booster. Declined flu shot.   [name].Discussed low carb diet with 1500 calories and 80g of protein.  Exercising at least 150 minutes a week.  [employer] could be a Microbiologist.  Sent wegovy. Discussed side effects Discussed titration up.  Follow up in 3 months.     See After Visit Summary for Counseling  Recommendations

## 2021-06-30 NOTE — Patient Instructions (Signed)

## 2021-07-02 NOTE — Progress Notes (Signed)
Hep C negative  CBC and Hemoglobin normal. Thyroid looks great Vitamin D better but still low. How much are you taking daily?  Elevated fasting glucose. Will add A1C to better evaluate.  HDL looks great.  LDL up some and not optimal but still looks ok as long as not diabetic. If diabetic then LdL goals change to under 70.  Kidney and liver function stable.

## 2021-07-06 ENCOUNTER — Encounter: Payer: Self-pay | Admitting: Physician Assistant

## 2021-07-06 DIAGNOSIS — R7303 Prediabetes: Secondary | ICD-10-CM | POA: Insufficient documentation

## 2021-07-06 LAB — CBC WITH DIFFERENTIAL/PLATELET
Absolute Monocytes: 500 cells/uL (ref 200–950)
Basophils Absolute: 21 cells/uL (ref 0–200)
Basophils Relative: 0.5 %
Eosinophils Absolute: 42 cells/uL (ref 15–500)
Eosinophils Relative: 1 %
HCT: 40.3 % (ref 35.0–45.0)
Hemoglobin: 13 g/dL (ref 11.7–15.5)
Lymphs Abs: 1705 cells/uL (ref 850–3900)
MCH: 28.8 pg (ref 27.0–33.0)
MCHC: 32.3 g/dL (ref 32.0–36.0)
MCV: 89.2 fL (ref 80.0–100.0)
MPV: 9.8 fL (ref 7.5–12.5)
Monocytes Relative: 11.9 %
Neutro Abs: 1932 cells/uL (ref 1500–7800)
Neutrophils Relative %: 46 %
Platelets: 357 10*3/uL (ref 140–400)
RBC: 4.52 10*6/uL (ref 3.80–5.10)
RDW: 13.3 % (ref 11.0–15.0)
Total Lymphocyte: 40.6 %
WBC: 4.2 10*3/uL (ref 3.8–10.8)

## 2021-07-06 LAB — LIPID PANEL W/REFLEX DIRECT LDL
Cholesterol: 182 mg/dL (ref <200)
HDL: 50 mg/dL (ref >=50)
LDL Cholesterol (Calc): 116 mg/dL (calc) — ABNORMAL HIGH
Non-HDL Cholesterol (Calc): 132 mg/dL (calc) — ABNORMAL HIGH (ref <130)
Total CHOL/HDL Ratio: 3.6 (calc) (ref <5.0)
Triglycerides: 64 mg/dL (ref <150)

## 2021-07-06 LAB — COMPLETE METABOLIC PANEL WITH GFR
AG Ratio: 1.3 (calc) (ref 1.0–2.5)
ALT: 16 U/L (ref 6–29)
AST: 38 U/L — ABNORMAL HIGH (ref 10–35)
Albumin: 4.3 g/dL (ref 3.6–5.1)
Alkaline phosphatase (APISO): 61 U/L (ref 31–125)
BUN/Creatinine Ratio: 12 (calc) (ref 6–22)
BUN: 12 mg/dL (ref 7–25)
CO2: 35 mmol/L — ABNORMAL HIGH (ref 20–32)
Calcium: 10 mg/dL (ref 8.6–10.2)
Chloride: 100 mmol/L (ref 98–110)
Creat: 1.01 mg/dL — ABNORMAL HIGH (ref 0.50–0.99)
Globulin: 3.3 g/dL (calc) (ref 1.9–3.7)
Glucose, Bld: 113 mg/dL — ABNORMAL HIGH (ref 65–99)
Potassium: 4 mmol/L (ref 3.5–5.3)
Sodium: 138 mmol/L (ref 135–146)
Total Bilirubin: 0.4 mg/dL (ref 0.2–1.2)
Total Protein: 7.6 g/dL (ref 6.1–8.1)
eGFR: 69 mL/min/{1.73_m2} (ref >=60)

## 2021-07-06 LAB — HEPATITIS C ANTIBODY
Hepatitis C Ab: NONREACTIVE
SIGNAL TO CUT-OFF: 0.06 (ref <1.00)

## 2021-07-06 LAB — HEMOGLOBIN A1C W/OUT EAG: Hgb A1c MFr Bld: 6.1 % of total Hgb — ABNORMAL HIGH (ref <5.7)

## 2021-07-06 LAB — TSH: TSH: 2.29 mIU/L

## 2021-07-06 LAB — VITAMIN D 25 HYDROXY (VIT D DEFICIENCY, FRACTURES): Vit D, 25-Hydroxy: 20 ng/mL — ABNORMAL LOW (ref 30–100)

## 2021-07-06 NOTE — Progress Notes (Signed)
A1C is 6.1.  That is pre-diabetes. DM starts at 6.5 or greater.  Start working on low [employer] and regular exercise now.  Recheck in 6 months.

## 2021-07-14 ENCOUNTER — Encounter: Payer: Self-pay | Admitting: Physician Assistant

## 2021-07-28 ENCOUNTER — Ambulatory Visit (AMBULATORY_SURGERY_CENTER): Payer: No Typology Code available for payment source | Admitting: *Deleted

## 2021-07-28 ENCOUNTER — Other Ambulatory Visit: Payer: Self-pay

## 2021-07-28 VITALS — Ht 69.0 in | Wt 282.0 lb

## 2021-07-28 DIAGNOSIS — Z1211 Encounter for screening for malignant neoplasm of colon: Secondary | ICD-10-CM

## 2021-07-28 MED ORDER — NA SULFATE-K SULFATE-MG SULF 17.5-3.13-1.6 GM/177ML PO SOLN: 1.0000 | ORAL | 0 refills | Status: DC

## 2021-07-28 NOTE — Progress Notes (Signed)

## 2021-08-06 ENCOUNTER — Encounter: Payer: Self-pay | Admitting: Gastroenterology

## 2021-08-11 ENCOUNTER — Ambulatory Visit (AMBULATORY_SURGERY_CENTER): Payer: No Typology Code available for payment source | Admitting: Gastroenterology

## 2021-08-11 ENCOUNTER — Other Ambulatory Visit: Payer: Self-pay

## 2021-08-11 ENCOUNTER — Encounter: Payer: Self-pay | Admitting: Gastroenterology

## 2021-08-11 VITALS — BP 100/52 | HR 74 | Temp 96.2°F | Resp 12 | Ht 69.0 in | Wt 282.0 lb

## 2021-08-11 DIAGNOSIS — Z1211 Encounter for screening for malignant neoplasm of colon: Secondary | ICD-10-CM | POA: Diagnosis not present

## 2021-08-11 DIAGNOSIS — D175 Benign lipomatous neoplasm of intra-abdominal organs: Secondary | ICD-10-CM

## 2021-08-11 DIAGNOSIS — K640 First degree hemorrhoids: Secondary | ICD-10-CM

## 2021-08-11 DIAGNOSIS — K635 Polyp of colon: Secondary | ICD-10-CM | POA: Diagnosis not present

## 2021-08-11 DIAGNOSIS — D125 Benign neoplasm of sigmoid colon: Secondary | ICD-10-CM

## 2021-08-11 DIAGNOSIS — D127 Benign neoplasm of rectosigmoid junction: Secondary | ICD-10-CM | POA: Diagnosis not present

## 2021-08-11 MED ORDER — SODIUM CHLORIDE 0.9 % IV SOLN: 500.0000 mL | Freq: Once | INTRAVENOUS | Status: DC

## 2021-08-11 NOTE — Progress Notes (Signed)
Called to room to assist during endoscopic procedure.  Patient ID and intended procedure confirmed with present staff. Received instructions for my participation in the procedure from the performing physician.  

## 2021-08-11 NOTE — Patient Instructions (Addendum)
Thank you for allowing [place] to care for you today. ?Await final biopsy results, approximately 2 weeks.  Will make recommendation at that time for next colonoscopy. ?Resume previous diet and medications today, return to normal daily activities tomorrow. ? ? ? ? ?YOU HAD AN ENDOSCOPIC PROCEDURE TODAY AT [facility]:   Refer to the procedure report that was given to you for any specific questions about what was found during the examination.  If the procedure report does not answer your questions, please call your gastroenterologist to clarify.  If you requested that your care partner not be given the details of your procedure findings, then the procedure report has been included in a sealed envelope for you to review at your convenience later. ? ?YOU SHOULD EXPECT: Some feelings of bloating in the abdomen. Passage of more gas than usual.  Walking can help get rid of the air that was put into your GI tract during the procedure and reduce the bloating. If you had a lower endoscopy (such as a colonoscopy or flexible sigmoidoscopy) you may notice spotting of blood in your stool or on the toilet paper. If you underwent a bowel prep for your procedure, you may not have a normal bowel movement for a few days. ? ?Please Note:  You might notice some irritation and congestion in your nose or some drainage.  This is from the oxygen used during your procedure.  There is no need for concern and it should clear up in a day or so. ? ?SYMPTOMS TO REPORT IMMEDIATELY: ? ?Following lower endoscopy (colonoscopy or flexible sigmoidoscopy): ? Excessive amounts of blood in the stool ? Significant tenderness or worsening of abdominal pains ? Swelling of the abdomen that is new, acute ? Fever of 100?F or higher ? ? ? ?For urgent or emergent issues, a gastroenterologist can be reached at any hour by calling [phone]. ?Do not use MyChart messaging for urgent concerns.  ? ? ?DIET:  We do recommend a small meal at first, but  then you may proceed to your regular diet.  Drink plenty of fluids but you should avoid alcoholic beverages for 24 hours. ? ?ACTIVITY:  You should plan to take it easy for the rest of today and you should NOT DRIVE or use heavy machinery until tomorrow (because of the sedation medicines used during the test).   ? ?FOLLOW UP: ?Our staff will call the number listed on your records 48-72 hours following your procedure to check on you and address any questions or concerns that you may have regarding the information given to you following your procedure. If we do not reach you, we will leave a message.  We will attempt to reach you two times.  During this call, we will ask if you have developed any symptoms of COVID 19. If you develop any symptoms (ie: fever, flu-like symptoms, shortness of breath, cough etc.) before then, please call [phone].  If you test positive for Covid 19 in the 2 weeks post procedure, please call and report this information to [place].   ? ?If any biopsies were taken you will be contacted by phone or by letter within the next 1-3 weeks.  Please call us at [phone] if you have not heard about the biopsies in 3 weeks.  ? ? ?SIGNATURES/CONFIDENTIALITY: ?You and/or your care partner have signed paperwork which will be entered into your electronic medical record.  These signatures attest to the fact that that the information above on your  After Visit Summary has been reviewed and is understood.  Full responsibility of the confidentiality of this discharge information lies with you and/or your care-partner.  ?

## 2021-08-11 NOTE — Progress Notes (Signed)
Pt's states no medical or surgical changes since previsit or office visit. VS by DT. °

## 2021-08-11 NOTE — Progress Notes (Signed)
? ?GASTROENTEROLOGY PROCEDURE H&P NOTE  ? ?Primary Care Physician: ?Donella Stade, PA-C ? ? ? ?Reason for Procedure:  Colon Cancer screening ? ?Plan:    Colonoscopy ? ?Patient is appropriate for endoscopic procedure(s) in the ambulatory ([facility]) setting. ? ?The nature of the procedure, as well as the risks, benefits, and alternatives were carefully and thoroughly reviewed with the patient. Ample time for discussion and questions allowed. The patient understood, was satisfied, and agreed to proceed.  ? ? ? ?HPI: ?Desiree Lawson is a 51 y.o. female who presents for colonoscopy for routine Colon Cancer screening.  No active GI symptoms.  No known family history of colon cancer or related malignancy.  Patient is otherwise without complaints or active issues today. ? ?Past Medical History:  ?Diagnosis Date  ? Hypertension   ? PONV (postoperative nausea and vomiting)   ? Seasonal allergies   ? SVD (spontaneous vaginal delivery)   ? x 1  ? Termination of pregnancy (fetus)   ? ? ?Past Surgical History:  ?Procedure Laterality Date  ? BREAST EXCISIONAL BIOPSY Left 1992  ? BREAST EXCISIONAL BIOPSY Right 2005  ? CHOLECYSTECTOMY    ? LAPAROSCOPIC BILATERAL SALPINGECTOMY Bilateral 03/14/2018  ? Procedure: LAPAROSCOPIC BILATERAL SALPINGECTOMY;  Surgeon: Emily Filbert, MD;  Location: [facility];  Service: Gynecology;  Laterality: Bilateral;  ? ? ?Prior to Admission medications   ?Medication Sig Start Date End Date Taking? Authorizing Provider  ?Ascorbic Acid (VITAMIN C) 1000 MG tablet Take 1,000 mg by mouth daily.   Yes [provider]  ?valsartan-hydrochlorothiazide (DIOVAN-HCT) 320-25 MG tablet Take 1 tablet by mouth daily. 06/30/21  Yes Breeback, Jade L, PA-C  ?diclofenac Sodium (VOLTAREN) 1 % GEL Apply 4 g topically 4 (four) times daily as needed. Appt needed 12/24/20   Donella Stade, PA-C  ?naproxen sodium (ANAPROX DS) 550 MG tablet Take 1 tablet (550 mg total) by mouth 2 (two) times daily with a meal. As  needed for pain or heavy bleeding ?Patient not taking: Reported on 07/28/2021 03/05/20   Osborne Oman, MD  ?rizatriptan (MAXALT-MLT) 10 MG disintegrating tablet TAKE 1 TABLET BY MOUTH AS NEEDED FOR MIGRAINE. MAY REPEAT IN 2 HOURS IF NEEDED ?Patient not taking: Reported on 07/28/2021 05/29/19   Donella Stade, PA-C  ?Semaglutide-Weight Management (WEGOVY) 0.25 MG/0.5ML SOAJ Inject 0.25 mg into the skin once a week. ?Patient not taking: Reported on 07/28/2021 06/30/21   Donella Stade, PA-C  ?tranexamic acid (LYSTEDA) 650 MG TABS tablet Take 2 tablets (1,300 mg total) by mouth 3 (three) times daily. Take during menses for a maximum of five days ?Patient not taking: Reported on 05/05/2020 03/05/20   Osborne Oman, MD  ? ? ?Current Outpatient Medications  ?Medication Sig Dispense Refill  ? Ascorbic Acid (VITAMIN C) 1000 MG tablet Take 1,000 mg by mouth daily.    ? valsartan-hydrochlorothiazide (DIOVAN-HCT) 320-25 MG tablet Take 1 tablet by mouth daily. 90 tablet 3  ? diclofenac Sodium (VOLTAREN) 1 % GEL Apply 4 g topically 4 (four) times daily as needed. Appt needed 100 g 0  ? naproxen sodium (ANAPROX DS) 550 MG tablet Take 1 tablet (550 mg total) by mouth 2 (two) times daily with a meal. As needed for pain or heavy bleeding (Patient not taking: Reported on 07/28/2021) 30 tablet 5  ? rizatriptan (MAXALT-MLT) 10 MG disintegrating tablet TAKE 1 TABLET BY MOUTH AS NEEDED FOR MIGRAINE. MAY REPEAT IN 2 HOURS IF NEEDED (Patient not taking: Reported on 07/28/2021) 10  tablet 0  ? Semaglutide-Weight Management (WEGOVY) 0.25 MG/0.5ML SOAJ Inject 0.25 mg into the skin once a week. (Patient not taking: Reported on 07/28/2021) 2 mL 0  ? tranexamic acid (LYSTEDA) 650 MG TABS tablet Take 2 tablets (1,300 mg total) by mouth 3 (three) times daily. Take during menses for a maximum of five days (Patient not taking: Reported on 05/05/2020) 30 tablet 2  ? ?Current Facility-Administered Medications  ?Medication Dose Route Frequency  Provider Last Rate Last Admin  ? 0.9 %  sodium chloride infusion  500 mL Intravenous Once Zamiah Tollett V, DO      ? ? ?Allergies as of 08/11/2021 - Review Complete 08/11/2021  ?Allergen Reaction Noted  ? Ramipril Other (See Comments) 11/16/2011  ? ? ?Family History  ?Problem Relation Age of Onset  ? Diabetes Mother   ? Stroke Mother   ? Hypertension Mother   ? Hypertension Father   ? Colon cancer Neg Hx   ? Esophageal cancer Neg Hx   ? Rectal cancer Neg Hx   ? Stomach cancer Neg Hx   ? ? ?Social History  ? ?Socioeconomic History  ? Marital status: Married  ?  Spouse name: Gerald Stabs  ? Number of children: Not on file  ? Years of education: Not on file  ? Highest education level: Not on file  ?Occupational History  ? Not on file  ?Tobacco Use  ? Smoking status: Never  ? Smokeless tobacco: Never  ?Vaping Use  ? Vaping Use: Never used  ?Substance and Sexual Activity  ? Alcohol use: Yes  ?  Alcohol/week: 2.0 - 3.0 standard drinks  ?  Types: 2 - 3 Glasses of wine per week  ? Drug use: No  ? Sexual activity: Yes  ?  Birth control/protection: Surgical  ?Other Topics Concern  ? Not on file  ?Social History Narrative  ? Not on file  ? ?Social Determinants of Health  ? ?Financial Resource Strain: Not on file  ?Food Insecurity: Not on file  ?Transportation Needs: Not on file  ?Physical Activity: Not on file  ?Stress: Not on file  ?Social Connections: Not on file  ?Intimate Partner Violence: Not on file  ? ? ?Physical Exam: ?Vital signs in last 24 hours: ?@BP  135/83   Pulse 83   Temp (!) 96.2 ?F (35.7 ?C)   Ht 5\' 9"  (1.753 m)   Wt 282 lb (127.9 kg)   SpO2 99%   BMI 41.64 kg/m?  ?GEN: NAD ?EYE: Sclerae anicteric ?ENT: MMM ?CV: Non-tachycardic ?Pulm: CTA b/l ?GI: Soft, NT/ND ?NEURO:  Alert & Oriented x 3 ? ? ?Gerrit Heck, DO ?[facility] ? ? ?08/11/2021 10:20 AM ? ?

## 2021-08-11 NOTE — Progress Notes (Signed)
Sedate, gd SR, tolerated procedure well, VSS, report to RN 

## 2021-08-11 NOTE — Op Note (Signed)
[place] ?Patient Name: Desiree Lawson ?Procedure Date: 08/11/2021 10:24 AM ?MRN: [id number] ?Endoscopist: Gerrit Heck , MD ?Age: 51 ?Referring MD:  ?Date of Birth: 1971/04/22 ?Gender: Female ?Account #: [id number] ?Procedure:                Colonoscopy ?Indications:              Screening for colorectal malignant neoplasm, This  ?                          is the patient's first colonoscopy ?Medicines:                Monitored Anesthesia Care ?Procedure:                Pre-Anesthesia Assessment: ?                          - Prior to the procedure, a History and Physical  ?                          was performed, and patient medications and  ?                          allergies were reviewed. The patient's tolerance of  ?                          previous anesthesia was also reviewed. The risks  ?                          and benefits of the procedure and the sedation  ?                          options and risks were discussed with the patient.  ?                          All questions were answered, and informed consent  ?                          was obtained. Prior Anticoagulants: The patient has  ?                          taken no previous anticoagulant or antiplatelet  ?                          agents. ASA Grade Assessment: III - A patient with  ?                          severe systemic disease. After reviewing the risks  ?                          and benefits, the patient was deemed in  ?                          satisfactory condition to undergo the procedure. ?  After obtaining informed consent, the colonoscope  ?                          was passed under direct vision. Throughout the  ?                          procedure, the patient's blood pressure, pulse, and  ?                          oxygen saturations were monitored continuously. The  ?                          Colonoscope was introduced through the anus and  ?                          advanced to the  the terminal ileum. The colonoscopy  ?                          was performed without difficulty. The patient  ?                          tolerated the procedure well. The quality of the  ?                          bowel preparation was good. The terminal ileum,  ?                          ileocecal valve, appendiceal orifice, and rectum  ?                          were photographed. ?Scope In: 10:28:52 AM ?Scope Out: 10:50:53 AM ?Scope Withdrawal Time: 0 hours 15 minutes 57 seconds  ?Total Procedure Duration: 0 hours 22 minutes 1 second  ?Findings:                 The perianal and digital rectal examinations were  ?                          normal. ?                          There was a large lipoma in the ascending colon.  ?                          Biopsies were taken with a cold forceps for  ?                          histology. Estimated blood loss was minimal. ?                          There was a small lipoma, at the hepatic flexure. ?                          Two sessile polyps were found in the sigmoid colon.  ?  The polyps were 3 to 4 mm in size. These polyps  ?                          were removed with a cold snare. Resection and  ?                          retrieval were complete. Estimated blood loss was  ?                          minimal. ?                          Non-bleeding internal hemorrhoids were found during  ?                          retroflexion. The hemorrhoids were small. ?                          The terminal ileum appeared normal. ?                          The ascending colon revealed moderately excessive  ?                          looping. Advancing the scope required using manual  ?                          pressure. ?Complications:            No immediate complications. ?Estimated Blood Loss:     Estimated blood loss was minimal. ?Impression:               - Large lipoma in the ascending colon. Biopsied. ?                          - Small lipoma at the  hepatic flexure. ?                          - Two 3 to 4 mm polyps in the sigmoid colon,  ?                          removed with a cold snare. Resected and retrieved. ?                          - Non-bleeding internal hemorrhoids. ?                          - The examined portion of the ileum was normal. ?                          - There was significant looping of the colon. ?Recommendation:           - Patient has a contact number available for  ?                          emergencies. The  signs and symptoms of potential  ?                          delayed complications were discussed with the  ?                          patient. Return to normal activities tomorrow.  ?                          Written discharge instructions were provided to the  ?                          patient. ?                          - Resume previous diet. ?                          - Continue present medications. ?                          - Await pathology results. ?                          - Repeat colonoscopy for surveillance based on  ?                          pathology results. ?                          - Return to GI office PRN. ?Gerrit Heck, MD ?08/11/2021 10:55:19 AM ?

## 2021-08-13 ENCOUNTER — Telehealth: Payer: Self-pay

## 2021-08-13 NOTE — Telephone Encounter (Signed)
?  Follow up Call- ? ?Call back number 08/11/2021  ?Post procedure Call Back phone  # [phone]  ?Permission to leave phone message Yes  ?Some recent data might be hidden  ?  ? ?Patient questions: ? ?Do you have a fever, pain , or abdominal swelling? No. ?Pain Score  0 * ? ?Have you tolerated food without any problems? Yes.   ? ?Have you been able to return to your normal activities? Yes.   ? ?Do you have any questions about your discharge instructions: ?Diet   No. ?Medications  No. ?Follow up visit  No. ? ?Do you have questions or concerns about your Care? No. ? ?Actions: ?* If pain score is 4 or above: ?No action needed, pain <4. ? ? ?

## 2021-08-23 ENCOUNTER — Encounter: Payer: Self-pay | Admitting: Gastroenterology

## 2021-09-28 ENCOUNTER — Telehealth: Payer: Self-pay

## 2021-09-28 ENCOUNTER — Encounter: Payer: Self-pay | Admitting: Physician Assistant

## 2021-09-28 ENCOUNTER — Ambulatory Visit: Payer: No Typology Code available for payment source | Admitting: Physician Assistant

## 2021-09-28 VITALS — BP 152/88 | HR 77 | Ht 69.0 in | Wt 290.0 lb

## 2021-09-28 DIAGNOSIS — Z23 Encounter for immunization: Secondary | ICD-10-CM | POA: Diagnosis not present

## 2021-09-28 DIAGNOSIS — I10 Essential (primary) hypertension: Secondary | ICD-10-CM | POA: Diagnosis not present

## 2021-09-28 DIAGNOSIS — R7303 Prediabetes: Secondary | ICD-10-CM

## 2021-09-28 DIAGNOSIS — Z6841 Body Mass Index (BMI) 40.0 and over, adult: Secondary | ICD-10-CM

## 2021-09-28 LAB — POCT GLYCOSYLATED HEMOGLOBIN (HGB A1C): Hemoglobin A1C: 5.7 % — AB (ref 4.0–5.6)

## 2021-09-28 MED ORDER — AMLODIPINE BESYLATE 2.5 MG PO TABS: 2.5000 mg | ORAL_TABLET | Freq: Every day | ORAL | 0 refills | Status: DC

## 2021-09-28 MED ORDER — VALSARTAN-HYDROCHLOROTHIAZIDE 320-25 MG PO TABS: 1.0000 | ORAL_TABLET | Freq: Every day | ORAL | 3 refills | Status: DC

## 2021-09-28 MED ORDER — WEGOVY 0.25 MG/0.5ML ~~LOC~~ SOAJ: 0.2500 mg | SUBCUTANEOUS | 0 refills | Status: DC

## 2021-09-28 NOTE — Progress Notes (Signed)
? ?Subjective:  ? ? Patient ID: Desiree Lawson, female    DOB: 13-Oct-1970, 51 y.o.   MRN: [id number] ? ?HPI ?Pt is a 51 yo obese female with HTN and pre-diabetes who presents to the clinic for follow up.  ? ?She was never able to start wegovy due to not having in stock at [facility]. She is working on diet and exercise. She admits she has not been doing well at it. She has done better with diet and decreasing sugars/carbs. No CP, palpitations, headaches. She has checked her BP at home a few times and ranges 130-140s over 80 to 90s. She is taking her diovan/HCT daily.  ? ?.. ?Active Ambulatory Problems  ?  Diagnosis Date Noted  ? Hypertension, essential, benign 09/13/2011  ? Other fatigue 02/27/2015  ? Vitamin D deficiency 03/01/2015  ? Class 3 severe obesity due to excess calories with serious comorbidity and body mass index (BMI) of 40.0 to 44.9 in adult [facility]) 03/26/2015  ? Numbness of right hand 03/13/2019  ? Cyst of bone of right hand 06/11/2019  ? Right arm numbness 06/11/2019  ? Bilateral carpal tunnel syndrome 07/31/2019  ? Sebaceous cyst 12/02/2019  ? Heavy menstrual bleeding 02/10/2020  ? Uterine fibroid 02/18/2020  ? Pre-diabetes 07/06/2021  ? ?Resolved Ambulatory Problems  ?  Diagnosis Date Noted  ? No Resolved Ambulatory Problems  ? ?Past Medical History:  ?Diagnosis Date  ? Hypertension   ? PONV (postoperative nausea and vomiting)   ? Seasonal allergies   ? SVD (spontaneous vaginal delivery)   ? Termination of pregnancy (fetus)   ? ? ? ?Review of Systems ?See HPI.  ?   ?Objective:  ? Physical Exam ?Vitals reviewed.  ?Constitutional:   ?   Appearance: Normal appearance. She is obese.  ?HENT:  ?   Head: Normocephalic.  ?Cardiovascular:  ?   Rate and Rhythm: Normal rate and regular rhythm.  ?   Pulses: Normal pulses.  ?   Heart sounds: Normal heart sounds.  ?Pulmonary:  ?   Effort: Pulmonary effort is normal.  ?   Breath sounds: Normal breath sounds.  ?Musculoskeletal:  ?   Right lower leg: No edema.  ?    Left lower leg: No edema.  ?Neurological:  ?   General: No focal deficit present.  ?   Mental Status: She is alert and oriented to person, place, and time.  ?Psychiatric:     ?   Mood and Affect: Mood normal.  ? ? ?.. ?Results for orders placed or performed in visit on 09/28/21  ?POCT glycosylated hemoglobin (Hb A1C)  ?Result Value Ref Range  ? Hemoglobin A1C 5.7 (A) 4.0 - 5.6 %  ? HbA1c POC (<> result, manual entry)    ? HbA1c, POC (prediabetic range)    ? HbA1c, POC (controlled diabetic range)    ? ? ? ? ?   ?Assessment & Plan:  ?..Desiree Lawson was seen today for follow-up. ? ?Diagnoses and all orders for this visit: ? ?Class 3 severe obesity due to excess calories with serious comorbidity and body mass index (BMI) of 40.0 to 44.9 in adult [facility]) ?-     WEGOVY 0.25 MG/0.5ML SOAJ; Inject 0.25 mg into the skin once a week. Use this dose for 1 month (4 shots) and then increase to next higher dose. ? ?Pre-diabetes ?-     POCT glycosylated hemoglobin (Hb A1C) ? ?Need for Tdap vaccination ?-     Tdap vaccine greater than or equal to  7yo IM ? ?Hypertension, essential, benign ?-     amLODipine (NORVASC) 2.5 MG tablet; Take 1 tablet (2.5 mg total) by mouth daily. ? ? ?A1C moving in the right direction ?Way to go!  ?Tdap given today ? ?BP not to goal ?Added norvasc 2.'5mg'$  daily to her diovan/HCTZ ?Keep log at home of BP readings ?Follow up in 1 month ? ?Sent wegovy to another pharmacy to start ?Discussed titration ?Discussed side effects ?Follow up in 1 month ? ? ?

## 2021-09-28 NOTE — Telephone Encounter (Addendum)
Initiated Prior authorization ULA:GTXMIW 0.'25MG'$ /0.5ML auto-injectors ?Via: Covermymeds ?Case/Key: [id number] ?Status: approved as of 09/28/21 ?Reason:this request is approved from 09/28/2021 to 04/30/2022 ?Notified Pt via: Mychart ?

## 2021-09-30 MED ORDER — AMLODIPINE BESYLATE 2.5 MG PO TABS: 2.5000 mg | ORAL_TABLET | Freq: Every day | ORAL | 0 refills | Status: DC

## 2021-10-27 ENCOUNTER — Other Ambulatory Visit: Payer: Self-pay | Admitting: Physician Assistant

## 2021-10-27 DIAGNOSIS — I10 Essential (primary) hypertension: Secondary | ICD-10-CM

## 2021-10-28 ENCOUNTER — Encounter: Payer: Self-pay | Admitting: Physician Assistant

## 2021-10-28 ENCOUNTER — Ambulatory Visit: Payer: No Typology Code available for payment source | Admitting: Physician Assistant

## 2021-10-28 VITALS — BP 136/79 | HR 78 | Ht 69.0 in | Wt 282.0 lb

## 2021-10-28 DIAGNOSIS — Z6841 Body Mass Index (BMI) 40.0 and over, adult: Secondary | ICD-10-CM | POA: Diagnosis not present

## 2021-10-28 DIAGNOSIS — I10 Essential (primary) hypertension: Secondary | ICD-10-CM

## 2021-10-28 MED ORDER — WEGOVY 0.5 MG/0.5ML ~~LOC~~ SOAJ: 0.5000 mg | SUBCUTANEOUS | 0 refills | Status: DC

## 2021-10-28 MED ORDER — WEGOVY 1 MG/0.5ML ~~LOC~~ SOAJ
1.0000 mg | SUBCUTANEOUS | 0 refills | Status: DC
Start: 2021-11-28 — End: 2021-11-24

## 2021-10-28 MED ORDER — AMLODIPINE BESYLATE 2.5 MG PO TABS: 2.5000 mg | ORAL_TABLET | Freq: Every day | ORAL | 1 refills | Status: DC

## 2021-10-28 MED ORDER — WEGOVY 1.7 MG/0.75ML ~~LOC~~ SOAJ: 1.7000 mg | SUBCUTANEOUS | 0 refills | Status: DC

## 2021-10-28 NOTE — Progress Notes (Signed)
Established Patient Office Visit  Subjective   Patient ID: Desiree Lawson, female    DOB: Oct 12, 1970  Age: 51 y.o. MRN: [id number]  Chief Complaint  Patient presents with   Follow-up    HPI Pt is a 51 yo obese female who presents to the clinic for medication follow up.   She was started on norvasc and wegovy at last visit. She is checking her BP at home and running 120-130 over 70-80. No problems or concerns with medication. She has lost 12lbs. She is walking and eating less. She has been a little nauseated but no other issues.   .. Active Ambulatory Problems    Diagnosis Date Noted   Hypertension, essential, benign 09/13/2011   Other fatigue 02/27/2015   Vitamin D deficiency 03/01/2015   Class 3 severe obesity due to excess calories with serious comorbidity and body mass index (BMI) of 40.0 to 44.9 in adult ([place]) 03/26/2015   Numbness of right hand 03/13/2019   Cyst of bone of right hand 06/11/2019   Right arm numbness 06/11/2019   Bilateral carpal tunnel syndrome 07/31/2019   Sebaceous cyst 12/02/2019   Heavy menstrual bleeding 02/10/2020   Uterine fibroid 02/18/2020   Pre-diabetes 07/06/2021   Resolved Ambulatory Problems    Diagnosis Date Noted   No Resolved Ambulatory Problems   Past Medical History:  Diagnosis Date   Hypertension    PONV (postoperative nausea and vomiting)    Seasonal allergies    SVD (spontaneous vaginal delivery)    Termination of pregnancy (fetus)      Review of Systems  All other systems reviewed and are negative.    Objective:     BP 136/79   Pulse 78   Ht '5\' 9"'$  (1.753 m)   Wt 282 lb (127.9 kg)   SpO2 99%   BMI 41.64 kg/m  BP Readings from Last 3 Encounters:  10/28/21 136/79  09/28/21 (!) 152/88  08/11/21 (!) 100/52      Physical Exam Vitals reviewed.  Constitutional:      Appearance: Normal appearance.  HENT:     Head: Normocephalic.  Cardiovascular:     Rate and Rhythm: Normal rate and regular rhythm.      Pulses: Normal pulses.  Pulmonary:     Effort: Pulmonary effort is normal.  Musculoskeletal:     Right lower leg: No edema.     Left lower leg: No edema.  Neurological:     General: No focal deficit present.     Mental Status: She is alert and oriented to person, place, and time.  Psychiatric:        Mood and Affect: Mood normal.      Assessment & Plan:  .Makena was seen today for follow-up.  Diagnoses and all orders for this visit:  Class 3 severe obesity due to excess calories with serious comorbidity and body mass index (BMI) of 40.0 to 44.9 in adult ([facility]) -     WEGOVY 0.5 MG/0.5ML SOAJ; Inject 0.5 mg into the skin once a week. Use this dose for 1 month (4 shots) and then increase to next higher dose. -     WEGOVY 1 MG/0.5ML SOAJ; Inject 1 mg into the skin once a week. Use this dose for 1 month (4 shots) and then increase to next higher dose. -     WEGOVY 1.7 MG/0.75ML SOAJ; Inject 1.7 mg into the skin once a week. Use this dose for 1 month (4 shots) and then increase to  next higher dose.  Hypertension, essential, benign -     amLODipine (NORVASC) 2.5 MG tablet; Take 1 tablet (2.5 mg total) by mouth daily.  Sent wegovy for 3 more months Continue diet and exercise BP looks good Refilled amlodipine  Follow up in 3 months.   Iran Planas, PA-C

## 2021-10-29 ENCOUNTER — Ambulatory Visit: Payer: No Typology Code available for payment source | Admitting: Physician Assistant

## 2021-11-21 ENCOUNTER — Encounter: Payer: Self-pay | Admitting: Physician Assistant

## 2021-11-23 ENCOUNTER — Other Ambulatory Visit: Payer: Self-pay | Admitting: Physician Assistant

## 2021-11-23 DIAGNOSIS — I10 Essential (primary) hypertension: Secondary | ICD-10-CM

## 2021-11-24 MED ORDER — WEGOVY 1 MG/0.5ML ~~LOC~~ SOAJ: 1.0000 mg | SUBCUTANEOUS | 0 refills | Status: DC

## 2021-11-24 MED ORDER — WEGOVY 0.5 MG/0.5ML ~~LOC~~ SOAJ: 0.5000 mg | SUBCUTANEOUS | 0 refills | Status: DC

## 2021-11-24 MED ORDER — WEGOVY 1.7 MG/0.75ML ~~LOC~~ SOAJ: 1.7000 mg | SUBCUTANEOUS | 0 refills | Status: DC

## 2022-01-16 ENCOUNTER — Encounter: Payer: Self-pay | Admitting: Physician Assistant

## 2022-01-17 MED ORDER — WEGOVY 2.4 MG/0.75ML ~~LOC~~ SOAJ
2.4000 mg | SUBCUTANEOUS | 0 refills | Status: DC
Start: 2022-01-17 — End: 2022-01-28

## 2022-01-28 ENCOUNTER — Encounter: Payer: Self-pay | Admitting: Physician Assistant

## 2022-01-28 ENCOUNTER — Ambulatory Visit: Payer: No Typology Code available for payment source | Admitting: Physician Assistant

## 2022-01-28 VITALS — BP 128/80 | HR 88 | Ht 69.0 in | Wt 257.0 lb

## 2022-01-28 DIAGNOSIS — Z6837 Body mass index (BMI) 37.0-37.9, adult: Secondary | ICD-10-CM | POA: Diagnosis not present

## 2022-01-28 DIAGNOSIS — I10 Essential (primary) hypertension: Secondary | ICD-10-CM | POA: Diagnosis not present

## 2022-01-28 MED ORDER — WEGOVY 2.4 MG/0.75ML ~~LOC~~ SOAJ: 2.4000 mg | SUBCUTANEOUS | 5 refills | Status: DC

## 2022-01-28 NOTE — Progress Notes (Signed)
Established Patient Office Visit  Subjective   Patient ID: Desiree Lawson, female    DOB: 20-Jun-1970  Age: 51 y.o. MRN: [id number]  Chief Complaint  Patient presents with   Obesity    HPI Pt is a 51 yo obese female with HTN who presents to the clinic for follow up.   She is doing great on wegovy. She is having constipation but controlled with metamucil. She is exercising regularly. She is eating less. She is down 33lbs since starting and 22lbs in the last 3 months.   Pt denies any CP, palpitations, headaches or vision changes. She is checking BP at home and in the 120s over 70s.    .. Active Ambulatory Problems    Diagnosis Date Noted   Hypertension, essential, benign 09/13/2011   Other fatigue 02/27/2015   Vitamin D deficiency 03/01/2015   Class 2 severe obesity due to excess calories with serious comorbidity and body mass index (BMI) of 37.0 to 37.9 in adult ([place]) 03/26/2015   Numbness of right hand 03/13/2019   Cyst of bone of right hand 06/11/2019   Right arm numbness 06/11/2019   Bilateral carpal tunnel syndrome 07/31/2019   Sebaceous cyst 12/02/2019   Heavy menstrual bleeding 02/10/2020   Uterine fibroid 02/18/2020   Pre-diabetes 07/06/2021   Resolved Ambulatory Problems    Diagnosis Date Noted   No Resolved Ambulatory Problems   Past Medical History:  Diagnosis Date   Hypertension    PONV (postoperative nausea and vomiting)    Seasonal allergies    SVD (spontaneous vaginal delivery)    Termination of pregnancy (fetus)      Review of Systems  All other systems reviewed and are negative.     Objective:     BP 128/80   Pulse 88   Ht '5\' 9"'$  (1.753 m)   Wt 257 lb (116.6 kg)   SpO2 99%   BMI 37.95 kg/m  BP Readings from Last 3 Encounters:  01/28/22 128/80  10/28/21 136/79  09/28/21 (!) 152/88   Wt Readings from Last 3 Encounters:  01/28/22 257 lb (116.6 kg)  10/28/21 282 lb (127.9 kg)  09/28/21 290 lb (131.5 kg)      Physical  Exam Constitutional:      Appearance: Normal appearance. She is obese.  HENT:     Head: Normocephalic.  Cardiovascular:     Rate and Rhythm: Normal rate and regular rhythm.     Pulses: Normal pulses.     Heart sounds: Normal heart sounds.  Pulmonary:     Effort: Pulmonary effort is normal.     Breath sounds: Normal breath sounds.  Musculoskeletal:     Right lower leg: No edema.     Left lower leg: No edema.  Neurological:     General: No focal deficit present.     Mental Status: She is alert and oriented to person, place, and time.  Psychiatric:        Mood and Affect: Mood normal.        The 10-year ASCVD risk score (Arnett DK, et al., 2019) is: 3.1%    Assessment & Plan:  Marland KitchenMarland KitchenMellina was seen today for obesity.  Diagnoses and all orders for this visit:  Class 2 severe obesity due to excess calories with serious comorbidity and body mass index (BMI) of 37.0 to 37.9 in adult ([facility]) -     WEGOVY 2.4 MG/0.75ML SOAJ; Inject 2.4 mg into the skin once a week.  Hypertension, essential, benign -  COMPLETE METABOLIC PANEL WITH GFR   BP on 2nd recheck much better Stay on diovan/HCT Cmp ordered for recheck today Recheck in 6 months  Continues to lose weight and doing well Continue wegovy Follow up in 6 months   Return in about 6 months (around 07/31/2022).    Iran Planas, PA-C

## 2022-01-29 LAB — COMPLETE METABOLIC PANEL WITH GFR
AG Ratio: 1.2 (calc) (ref 1.0–2.5)
ALT: 16 U/L (ref 6–29)
AST: 32 U/L (ref 10–35)
Albumin: 4.2 g/dL (ref 3.6–5.1)
Alkaline phosphatase (APISO): 60 U/L (ref 31–125)
BUN/Creatinine Ratio: 12 (calc) (ref 6–22)
BUN: 14 mg/dL (ref 7–25)
CO2: 29 mmol/L (ref 20–32)
Calcium: 9.9 mg/dL (ref 8.6–10.2)
Chloride: 99 mmol/L (ref 98–110)
Creat: 1.13 mg/dL — ABNORMAL HIGH (ref 0.50–0.99)
Globulin: 3.4 g/dL (calc) (ref 1.9–3.7)
Glucose, Bld: 98 mg/dL (ref 65–99)
Potassium: 3.5 mmol/L (ref 3.5–5.3)
Sodium: 139 mmol/L (ref 135–146)
Total Bilirubin: 0.4 mg/dL (ref 0.2–1.2)
Total Protein: 7.6 g/dL (ref 6.1–8.1)
eGFR: 60 mL/min/{1.73_m2} (ref >=60)

## 2022-01-31 ENCOUNTER — Encounter: Payer: Self-pay | Admitting: Physician Assistant

## 2022-01-31 DIAGNOSIS — R944 Abnormal results of kidney function studies: Secondary | ICD-10-CM

## 2022-01-31 NOTE — Progress Notes (Signed)
Glucose and liver function improved.  Your kidney function declined just a little more still above 60 but falling into chronic kidney disease.  Are you taking any anti-inflammatories regularly OTC?  I would like to get a renal ultrasound are you ok with this?

## 2022-02-07 ENCOUNTER — Ambulatory Visit (INDEPENDENT_AMBULATORY_CARE_PROVIDER_SITE_OTHER): Payer: No Typology Code available for payment source

## 2022-02-07 DIAGNOSIS — R944 Abnormal results of kidney function studies: Secondary | ICD-10-CM

## 2022-02-08 ENCOUNTER — Encounter: Payer: Self-pay | Admitting: Physician Assistant

## 2022-02-08 NOTE — Progress Notes (Signed)
Bilateral kidneys look great on ultrasound. No reason for any kidney function decline.  What are your thoughts on starting a medication that helps kidney function to maintain and at times improve? Its called farxiga.

## 2022-03-17 ENCOUNTER — Other Ambulatory Visit: Payer: Self-pay

## 2022-03-23 DIAGNOSIS — Z1231 Encounter for screening mammogram for malignant neoplasm of breast: Secondary | ICD-10-CM

## 2022-04-04 ENCOUNTER — Other Ambulatory Visit (HOSPITAL_BASED_OUTPATIENT_CLINIC_OR_DEPARTMENT_OTHER): Payer: Self-pay | Admitting: Physician Assistant

## 2022-04-04 DIAGNOSIS — Z1231 Encounter for screening mammogram for malignant neoplasm of breast: Secondary | ICD-10-CM

## 2022-04-13 ENCOUNTER — Ambulatory Visit (INDEPENDENT_AMBULATORY_CARE_PROVIDER_SITE_OTHER): Payer: No Typology Code available for payment source

## 2022-04-13 DIAGNOSIS — Z1231 Encounter for screening mammogram for malignant neoplasm of breast: Secondary | ICD-10-CM | POA: Diagnosis not present

## 2022-04-15 NOTE — Progress Notes (Signed)
Normal mammogram. Follow up in 1 year.

## 2022-05-13 ENCOUNTER — Telehealth: Payer: Self-pay

## 2022-05-13 NOTE — Telephone Encounter (Addendum)
Initiated Prior authorization CEY:EMVVKP 2.'4MG'$ /0.75ML auto-injectors [facility]: [facility]  Case/Key:[id number] Status: approved  as of 05/13/22 Reason: pt [facility] an [facility]  pt next fill date 12/20 Notified Pt via: Mychart   Last fill was 05/11/22

## 2022-05-13 NOTE — Telephone Encounter (Incomplete Revision)
Initiated Prior authorization XBL:TJQZES 2.'4MG'$ /0.75ML auto-injectors [facility]: [facility]  Case/Key:[id number] Status: approved  as of 05/13/22 Reason: pt has an appt [facility] ,  pt has refill too soon  pt next fill date 12/20 Notified Pt via: Mychart   Last fill was 05/11/22

## 2022-07-11 ENCOUNTER — Encounter: Payer: Self-pay | Admitting: Physician Assistant

## 2022-07-11 ENCOUNTER — Ambulatory Visit: Payer: No Typology Code available for payment source | Admitting: Physician Assistant

## 2022-07-11 VITALS — BP 158/97 | HR 106 | Ht 69.0 in | Wt 235.0 lb

## 2022-07-11 DIAGNOSIS — Z6837 Body mass index (BMI) 37.0-37.9, adult: Secondary | ICD-10-CM

## 2022-07-11 DIAGNOSIS — I10 Essential (primary) hypertension: Secondary | ICD-10-CM

## 2022-07-11 DIAGNOSIS — Z23 Encounter for immunization: Secondary | ICD-10-CM

## 2022-07-11 MED ORDER — VALSARTAN-HYDROCHLOROTHIAZIDE 320-25 MG PO TABS: 1.0000 | ORAL_TABLET | Freq: Every day | ORAL | 3 refills | Status: DC

## 2022-07-11 MED ORDER — WEGOVY 2.4 MG/0.75ML ~~LOC~~ SOAJ: 2.4000 mg | SUBCUTANEOUS | 5 refills | Status: DC

## 2022-07-11 NOTE — Progress Notes (Signed)
Established Patient Office Visit  Subjective   Patient ID: Desiree Lawson, female    DOB: 1970/08/12  Age: 52 y.o. MRN: [id number]  Chief Complaint  Patient presents with   Follow-up    HPI  Desiree Lawson is a 52 year old woman with a PMH significant for hypertension, class 2 obesity and prediabetes.  She has been on [facility] for one year. She has lost 22 pounds in the last 6 months and a total of 51 pounds since she started Wegovy. She continues to have constipation and nausea but is tolerable and Metamucil helps. She is satisfied with the weight loss that she has had. She has been exercising some but admits that she could be doing better. She is planning to increase the amount. She reports that she maintains a healthy diet.   She checks her blood pressure at home and states it normally is around 130s/70s. She takes valsartan-HCTZ daily. Blood pressure on presentation today was 158/97. She has not taken her antihypertensive today. Denies chest pain, shortness of breath, vision changes, peripheral or headaches.     Review of Systems  Constitutional:  Positive for weight loss. Negative for chills and fever.  Eyes:  Negative for blurred vision and double vision.  Respiratory:  Negative for shortness of breath.   Cardiovascular:  Negative for chest pain, palpitations and leg swelling.  Gastrointestinal:  Positive for constipation and nausea.  Neurological:  Negative for headaches.      Objective:     There were no vitals taken for this visit. BP Readings from Last 3 Encounters:  07/11/22 (!) 158/97  01/28/22 128/80  10/28/21 136/79   Wt Readings from Last 3 Encounters:  07/11/22 106.6 kg  01/28/22 116.6 kg  10/28/21 127.9 kg    Physical Exam Constitutional:      General: She is not in acute distress. HENT:     Head: Normocephalic and atraumatic.     Right Ear: External ear normal.     Left Ear: External ear normal.     Nose: Nose normal.      Mouth/Throat:     Pharynx: Oropharynx is clear.  Eyes:     Conjunctiva/sclera: Conjunctivae normal.  Cardiovascular:     Rate and Rhythm: Normal rate and regular rhythm.     Heart sounds: No murmur heard. Pulmonary:     Effort: Pulmonary effort is normal.     Breath sounds: Normal breath sounds.  Abdominal:     General: There is no distension.     Palpations: Abdomen is soft.  Musculoskeletal:     Right lower leg: No edema.     Left lower leg: No edema.  Skin:    General: Skin is warm.  Neurological:     Mental Status: She is alert and oriented to person, place, and time.  Psychiatric:        Mood and Affect: Mood normal.        Behavior: Behavior normal.     Last metabolic panel Lab Results  Component Value Date   GLUCOSE 98 01/28/2022   NA 139 01/28/2022   K 3.5 01/28/2022   CL 99 01/28/2022   CO2 29 01/28/2022   BUN 14 01/28/2022   CREATININE 1.13 (H) 01/28/2022   EGFR 60 01/28/2022   CALCIUM 9.9 01/28/2022   PROT 7.6 01/28/2022   ALBUMIN 4.0 02/27/2015   BILITOT 0.4 01/28/2022   ALKPHOS 48 02/27/2015   AST 32 01/28/2022   ALT 16 01/28/2022  ANIONGAP 9 03/05/2018   Last hemoglobin A1c Lab Results  Component Value Date   HGBA1C 5.7 (A) 09/28/2021    The 10-year ASCVD risk score (Arnett DK, et al., 2019) is: 3.1%    Assessment & Plan:  Marland KitchenMarland KitchenImunique was seen today for follow-up.  Diagnoses and all orders for this visit:  Hypertension, essential, benign -     COMPLETE METABOLIC PANEL WITH GFR -     valsartan-hydrochlorothiazide (DIOVAN-HCT) 320-25 MG tablet; Take 1 tablet by mouth daily.  Encounter for immunization [employer] Fall 2023 Covid-19 Vaccine 50yr and older  Class 2 severe obesity due to excess calories with serious comorbidity and body mass index (BMI) of 37.0 to 37.9 in adult (HCC) -     WEGOVY 2.4 MG/0.75ML SOAJ; Inject 2.4 mg into the skin once a week.      Patient is doing well and is having great results with Wegovy. Side  effects are manageable. A refill has been sent to the pharmacy today. Encouraged her to continue exercising at least 4 days a week and maintain a healthy diet. Worried about insurance paying. Ok to download savings card or ask about zepbound.   Her creatinine was elevated a few months ago in August 2023 at 1.13. GFR was 60. Will recheck today. Renal ultrasound in August did not show evidence of mass or hydronephrosis.  Blood pressure was elevated on presentation. Improved on 2nd recheck but still not to goal. She has not taken the valsartan-HCTZ yet today. She states at home it normally is 130s/70s. Recommend she continue to check her blood pressure at home and let me know if they are consistently > 140s/90s or she experiences any chest pain, vision changes or headaches. A refill has been sent to the pharmacy.  She received the Covid vaccine today.   Return in about 6 months (around 01/09/2023).

## 2022-07-11 NOTE — Patient Instructions (Signed)
Zepbound for weight loss.

## 2022-07-12 LAB — COMPLETE METABOLIC PANEL WITH GFR
AG Ratio: 1.3 (calc) (ref 1.0–2.5)
ALT: 12 U/L (ref 6–29)
AST: 32 U/L (ref 10–35)
Albumin: 4.2 g/dL (ref 3.6–5.1)
Alkaline phosphatase (APISO): 58 U/L (ref 31–125)
BUN: 13 mg/dL (ref 7–25)
CO2: 30 mmol/L (ref 20–32)
Calcium: 9.9 mg/dL (ref 8.6–10.2)
Chloride: 104 mmol/L (ref 98–110)
Creat: 0.94 mg/dL (ref 0.50–0.99)
Globulin: 3.3 g/dL (calc) (ref 1.9–3.7)
Glucose, Bld: 90 mg/dL (ref 65–99)
Potassium: 4.6 mmol/L (ref 3.5–5.3)
Sodium: 141 mmol/L (ref 135–146)
Total Bilirubin: 0.3 mg/dL (ref 0.2–1.2)
Total Protein: 7.5 g/dL (ref 6.1–8.1)
eGFR: 74 mL/min/{1.73_m2} (ref >=60)

## 2022-07-12 NOTE — Progress Notes (Signed)
Kidney function looks MUCH better! GREAT news.

## 2022-08-01 ENCOUNTER — Ambulatory Visit: Payer: No Typology Code available for payment source | Admitting: Physician Assistant

## 2022-08-12 ENCOUNTER — Other Ambulatory Visit: Payer: Self-pay | Admitting: Pharmacist

## 2022-08-12 NOTE — Patient Instructions (Signed)
Desiree Lawson,  Thank you for speaking with me today. As discussed, keep up the great work with taking blood pressure medicine and checking your home readings.  Below are some helpful tips with checking blood pressure:  Check your blood pressure twice weekly, and any time you have concerning symptoms like headache, chest pain, dizziness, shortness of breath, or vision changes.   Our goal is less than 140/90.  To appropriately check your blood pressure, make sure you do the following:  1) Avoid caffeine, exercise, or tobacco products for 30 minutes before checking. Empty your bladder. 2) Sit with your back supported in a flat-backed chair. Rest your arm on something flat (arm of the chair, table, etc). 3) Sit still with your feet flat on the floor, resting, for at least 5 minutes.  4) Check your blood pressure. Take 1-2 readings.  5) Write down these readings and bring with you to any provider appointments.  Bring your home blood pressure machine with you to a provider's office for accuracy comparison at least once a year.   Make sure you take your blood pressure medications before you come to any office visit, even if you were asked to fast for labs.   Take care, Desiree Lawson, PharmD Clinical Pharmacist [facility] [phone]

## 2022-08-12 NOTE — Progress Notes (Signed)
Patient appearing on report for [employer] - Hypertension Control report due to last documented ambulatory blood pressure of 158/97 on 07/11/22. Next appointment with PCP is 01/09/23   Outreached patient to discuss hypertension control and medication management.   Current antihypertensives: valsartan-hctz 320-'25mg'$  daily   Patient has an automated upper arm home BP machine.  Current blood pressure readings: 132/86 yesterday, [id number] systolic  Patient denies hypotensive signs and symptoms including dizziness, lightheadedness.  Patient denies hypertensive symptoms including headache, chest pain, shortness of breath.    Assessment/Plan: - Currently controlled - - Reviewed goal blood pressure <140/90 - Reviewed appropriate home BP monitoring technique (avoid caffeine, smoking, and exercise for 30 minutes before checking, rest for at least 5 minutes before taking BP, sit with feet flat on the floor and back against a hard surface, uncross legs, and rest arm on flat surface) - Reviewed to check blood pressure periodically, document, and provide at next provider visit - Discussed dietary modifications, such as reduced salt intake, focus on whole grains, vegetables, lean proteins - Discussed goal of 150 minutes of moderate intensity physical activity weekly - Recommend continue current regimen  Larinda Buttery, PharmD Clinical Pharmacist [facility] [phone]

## 2023-01-09 ENCOUNTER — Ambulatory Visit: Payer: No Typology Code available for payment source | Admitting: Physician Assistant

## 2023-01-09 ENCOUNTER — Encounter: Payer: Self-pay | Admitting: Physician Assistant

## 2023-01-09 VITALS — BP 120/70 | HR 77 | Ht 69.0 in | Wt 229.1 lb

## 2023-01-09 DIAGNOSIS — Z6833 Body mass index (BMI) 33.0-33.9, adult: Secondary | ICD-10-CM

## 2023-01-09 DIAGNOSIS — Z13220 Encounter for screening for lipoid disorders: Secondary | ICD-10-CM | POA: Diagnosis not present

## 2023-01-09 DIAGNOSIS — M25512 Pain in left shoulder: Secondary | ICD-10-CM | POA: Insufficient documentation

## 2023-01-09 DIAGNOSIS — I10 Essential (primary) hypertension: Secondary | ICD-10-CM | POA: Diagnosis not present

## 2023-01-09 DIAGNOSIS — R7303 Prediabetes: Secondary | ICD-10-CM

## 2023-01-09 DIAGNOSIS — E6609 Other obesity due to excess calories: Secondary | ICD-10-CM

## 2023-01-09 MED ORDER — WEGOVY 2.4 MG/0.75ML ~~LOC~~ SOAJ: 2.4000 mg | SUBCUTANEOUS | 5 refills | Status: DC

## 2023-01-09 MED ORDER — VALSARTAN-HYDROCHLOROTHIAZIDE 320-25 MG PO TABS: 1.0000 | ORAL_TABLET | Freq: Every day | ORAL | 3 refills | Status: DC

## 2023-01-09 NOTE — Progress Notes (Signed)
Established Patient Office Visit  Subjective   Patient ID: Desiree Lawson, female    DOB: August 28, 1970  Age: 52 y.o. MRN: [id number]  Chief Complaint  Patient presents with   Hypertension    HPI Pt is a 52 yo obese female with HTN, vitamin D deficiency, pre-diabetes who presents to the clinic to get medication refills.   Pt is doing great with weight loss. She is down another 6lbs. She is on weogvy every 2weeks due to cost. She is exercising 2-3 times a week by walking 1.5 miles. She denies any CP, palpitations, headaches or vision changes. She is on diovan/HcT and wegovy for weight.   She had a fall forward about 1 month ago and landed on her left side. Her left shoulder has hurt ever since. Worse with movement.    .. Active Ambulatory Problems    Diagnosis Date Noted   Hypertension, essential, benign 09/13/2011   Other fatigue 02/27/2015   Vitamin D deficiency 03/01/2015   Class 2 severe obesity due to excess calories with serious comorbidity and body mass index (BMI) of 37.0 to 37.9 in adult ([facility]) 03/26/2015   Numbness of right hand 03/13/2019   Cyst of bone of right hand 06/11/2019   Right arm numbness 06/11/2019   Bilateral carpal tunnel syndrome 07/31/2019   Sebaceous cyst 12/02/2019   Heavy menstrual bleeding 02/10/2020   Uterine fibroid 02/18/2020   Pre-diabetes 07/06/2021   Acute pain of left shoulder 01/09/2023   Resolved Ambulatory Problems    Diagnosis Date Noted   No Resolved Ambulatory Problems   Past Medical History:  Diagnosis Date   Hypertension    PONV (postoperative nausea and vomiting)    Seasonal allergies    SVD (spontaneous vaginal delivery)    Termination of pregnancy (fetus)      ROS See HPI.    Objective:     BP 120/70   Pulse 77   Ht 5\' 9"  (1.753 m)   Wt 229 lb 1.9 oz (103.9 kg)   SpO2 98%   BMI 33.84 kg/m  BP Readings from Last 3 Encounters:  01/09/23 120/70  08/12/22 132/86  07/11/22 (!) 158/97   Wt Readings from  Last 3 Encounters:  01/09/23 229 lb 1.9 oz (103.9 kg)  07/11/22 235 lb (106.6 kg)  01/28/22 257 lb (116.6 kg)      Physical Exam Constitutional:      Appearance: Normal appearance. She is obese.  HENT:     Head: Normocephalic.  Cardiovascular:     Rate and Rhythm: Normal rate and regular rhythm.     Pulses: Normal pulses.     Heart sounds: Normal heart sounds.  Pulmonary:     Effort: Pulmonary effort is normal.     Breath sounds: Normal breath sounds.  Musculoskeletal:     Right lower leg: No edema.     Left lower leg: No edema.     Comments: NROM of left shoulder No tenderness to palpation Pain with external ROM Negative drop arm sign No swelling   Neurological:     General: No focal deficit present.     Mental Status: She is alert and oriented to person, place, and time.  Psychiatric:        Mood and Affect: Mood normal.       The 10-year ASCVD risk score (Arnett DK, et al., 2019) is: 3.2%    Assessment & Plan:  Marland KitchenMarland KitchenCeonna was seen today for hypertension.  Diagnoses and all orders for  this visit:  Class 1 obesity due to excess calories with serious comorbidity and body mass index (BMI) of 33.0 to 33.9 in adult -     WEGOVY 2.4 MG/0.75ML SOAJ; Inject 2.4 mg into the skin once a week. -     Hemoglobin A1c -     TSH  Hypertension, essential, benign -     valsartan-hydrochlorothiazide (DIOVAN-HCT) 320-25 MG tablet; Take 1 tablet by mouth daily.  Pre-diabetes -     Hemoglobin A1c -     CMP14+EGFR  Screening for lipid disorders -     Lipid panel  Acute pain of left shoulder   Vitals look great on 2nd BP check Continue on diovan/HCT Continues to lose weight even taking wegovy every other week due to cost Down 6 more lbs Pt walking 2-3 days a week 1.5 miles. Encouraged her to increase to 3-4 times a week Continue on wegovy weekly Fasting labs ordered  Discussed left arm pain After exam seems like shoulder impingement Gave exercises Use NSAIds  as needed Follow up if not improving or symptoms worsening   Return in about 6 months (around 07/12/2023).    Tandy Gaw, PA-C

## 2023-01-09 NOTE — Patient Instructions (Signed)
Shoulder Impingement Syndrome Rehab Ask your health care provider which exercises are safe for you. Do exercises exactly as told by your provider and adjust them as told. It is normal to feel mild stretching, pulling, tightness, or discomfort as you do these exercises. Stop right away if you feel sudden pain or your pain gets worse. Do not begin these exercises until told by your provider. Stretching and range-of-motion exercise This exercise warms up your muscles and joints and improves the movement and flexibility of your shoulder. This exercise also helps to relieve pain and stiffness. Passive horizontal adduction In passive adduction, you use your other hand to move the injured arm toward your body. The injured arm does not move on its own. In this movement, your arm is moved across your body in the horizontal plane (horizontal adduction). Sit or stand and pull your left / right elbow across your chest, toward your other shoulder. Stop when you feel a gentle stretch in the back of your shoulder and upper arm. Keep your arm at shoulder height. Keep your arm as close to your body as you comfortably can. Hold for __________ seconds. Slowly return to the starting position. Repeat __________ times. Complete this exercise __________ times a day. Strengthening exercises These exercises build strength and endurance in your shoulder. Endurance is the ability to use your muscles for a long time, even after they get tired. External rotation, isometric This is an exercise in which you press the back of your wrist against a doorframe without moving your shoulder joint (isometric). Stand or sit in a doorway, facing the door frame. Bend your left / right elbow and place the back of your wrist against the doorframe. Only the back of your wrist should be touching the frame. Keep your upper arm at your side. Gently press your wrist against the doorframe, as if you are trying to push your arm away from your  abdomen (external rotation). Press as hard as you are able without pain. Avoid shrugging your shoulder while you press your wrist against the doorframe. Keep your shoulder blade tucked down toward the middle of your back. Hold for __________ seconds. Slowly release the tension, and relax your muscles completely before you repeat the exercise. Repeat __________ times. Complete this exercise __________ times a day. Internal rotation, isometric This is an exercise in which you press your palm against a doorframe without moving your shoulder joint (isometric). Stand or sit in a doorway, facing the doorframe. Bend your left / right elbow and place the palm of your hand against the doorframe. Only your palm should be touching the frame. Keep your upper arm at your side. Gently press your hand against the doorframe, as if you are trying to push your arm toward your abdomen (internal rotation). Press as hard as you are able without pain. Avoid shrugging your shoulder while you press your hand against the doorframe. Keep your shoulder blade tucked down toward the middle of your back. Hold for __________ seconds. Slowly release the tension, and relax your muscles completely before you repeat the exercise. Repeat __________ times. Complete this exercise __________ times a day. Scapular protraction, supine, isotonic  Lie on your back on a firm surface (supine position). Hold a __________ weight in your left / right hand. Raise your left / right arm straight into the air so your hand is directly above your shoulder joint. Push the weight into the air so your shoulder (scapula) lifts off the surface that you are lying on.  The scapula will push up or forward (protraction). Do not move your head, neck, or back. Hold for __________ seconds. Slowly return to the starting position. Let your muscles relax completely before you repeat this exercise. Repeat __________ times. Complete this exercise __________ times a  day. Scapular retraction, isotonic  Sit in a stable chair without armrests or stand up. Secure an exercise band to a stable object in front of you so the band is at shoulder height. Hold one end of the exercise band in each hand. Squeeze your shoulder blades together (retraction) and move your elbows slightly behind you. Do not shrug your shoulders upward while you do this. Hold for __________ seconds. Slowly return to the starting position. Repeat __________ times. Complete this exercise __________ times a day. Shoulder extension, isotonic  Sit in a stable chair without armrests or stand up. Secure an exercise band to a stable object in front of you so the band is above shoulder height. Hold one end of the exercise band in each hand. Straighten your elbows and lift your hands up to shoulder height. Squeeze your shoulder blades together and pull your hands down to the sides of your thighs (extension). Stop when your hands are straight down by your sides. Do not let your hands go behind your body. Hold for __________ seconds. Slowly return to the starting position. Repeat __________ times. Complete this exercise __________ times a day. This information is not intended to replace advice given to you by your health care provider. Make sure you discuss any questions you have with your health care provider. Document Revised: 02/24/2022 Document Reviewed: 02/24/2022 Elsevier Patient Education  2024 [employer].

## 2023-01-10 NOTE — Progress Notes (Signed)
Make sure taking vitamin D daily. To get calcium into bone.  Thyroid looks great.  Kidney and liver look great.  HDL looks great!  LDL just at optimal goal.  Overall 10 year risk is under 7.5 and good.    [name].The 10-year ASCVD risk score (Arnett DK, et al., 2019) is: 2.6%   Values used to calculate the score:     Age: 52 years     Sex: Female     Is Non-Hispanic African American: Yes     Diabetic: No     Tobacco smoker: No     Systolic Blood Pressure: 120 mmHg     Is BP treated: Yes     HDL Cholesterol: 49 mg/dL     Total Cholesterol: 178 mg/dL

## 2023-05-17 ENCOUNTER — Encounter: Payer: Self-pay | Admitting: Physician Assistant

## 2023-05-18 ENCOUNTER — Telehealth: Payer: Self-pay

## 2023-05-18 NOTE — Telephone Encounter (Signed)
Initiated Prior authorization AOZ:HYQMVH 2.4MG /0.75ML auto-injectors Via: Covermymeds Case/Key:[id number] Status: approved  as of 05/18/23 Reason:, this request is approved from 05/18/2023 to 05/17/2024 Notified Pt via: Mychart

## 2023-07-12 ENCOUNTER — Ambulatory Visit: Payer: No Typology Code available for payment source | Admitting: Physician Assistant

## 2023-07-12 VITALS — BP 139/88 | HR 72 | Ht 69.0 in | Wt 225.2 lb

## 2023-07-12 DIAGNOSIS — E6609 Other obesity due to excess calories: Secondary | ICD-10-CM

## 2023-07-12 DIAGNOSIS — Z1231 Encounter for screening mammogram for malignant neoplasm of breast: Secondary | ICD-10-CM

## 2023-07-12 DIAGNOSIS — Z6833 Body mass index (BMI) 33.0-33.9, adult: Secondary | ICD-10-CM

## 2023-07-12 DIAGNOSIS — E66811 Obesity, class 1: Secondary | ICD-10-CM

## 2023-07-12 DIAGNOSIS — I10 Essential (primary) hypertension: Secondary | ICD-10-CM

## 2023-07-12 DIAGNOSIS — R7303 Prediabetes: Secondary | ICD-10-CM

## 2023-07-12 DIAGNOSIS — Z23 Encounter for immunization: Secondary | ICD-10-CM | POA: Diagnosis not present

## 2023-07-12 MED ORDER — AMBULATORY NON FORMULARY MEDICATION: 1 refills | Status: DC

## 2023-07-12 NOTE — Progress Notes (Signed)
Established Patient Office Visit  Subjective   Patient ID: Desiree Lawson, female    DOB: 31-Dec-1970  Age: 53 y.o. MRN: [id number]  Chief Complaint  Patient presents with   Weight Management Screening    Follow up - discuss vaccines with provider ( shingrix )  Also requesting MMG order today    HPI Pt is a 53 yo obese female who presents to the clinic for 6 month follow up.   She has been off wegovy for 3 months due to insurance cost change to over 400 dollars a month. She continues to attempt healthy diet and regular exercise. Her weight is stable since stopping wegovy but she would like to get help losing more weight.   Active Ambulatory Problems    Diagnosis Date Noted   Hypertension, essential, benign 09/13/2011   Other fatigue 02/27/2015   Vitamin D deficiency 03/01/2015   Class 2 severe obesity due to excess calories with serious comorbidity and body mass index (BMI) of 37.0 to 37.9 in adult (HCC) 03/26/2015   Numbness of right hand 03/13/2019   Cyst of bone of right hand 06/11/2019   Right arm numbness 06/11/2019   Bilateral carpal tunnel syndrome 07/31/2019   Sebaceous cyst 12/02/2019   Heavy menstrual bleeding 02/10/2020   Uterine fibroid 02/18/2020   Pre-diabetes 07/06/2021   Acute pain of left shoulder 01/09/2023   Resolved Ambulatory Problems    Diagnosis Date Noted   No Resolved Ambulatory Problems   Past Medical History:  Diagnosis Date   Hypertension    PONV (postoperative nausea and vomiting)    Seasonal allergies    SVD (spontaneous vaginal delivery)    Termination of pregnancy (fetus)      ROS See HPI.    Objective:     BP 139/88   Pulse 72   Ht 5\' 9"  (1.753 m)   Wt 225 lb 4 oz (102.2 kg)   SpO2 98%   BMI 33.26 kg/m  BP Readings from Last 3 Encounters:  07/12/23 139/88  01/09/23 120/70  08/12/22 132/86   Wt Readings from Last 3 Encounters:  07/12/23 225 lb 4 oz (102.2 kg)  01/09/23 229 lb 1.9 oz (103.9 kg)  07/11/22 235  lb (106.6 kg)      Physical Exam Constitutional:      Appearance: Normal appearance. She is obese.  HENT:     Head: Normocephalic.  Cardiovascular:     Rate and Rhythm: Normal rate and regular rhythm.  Pulmonary:     Effort: Pulmonary effort is normal.     Breath sounds: Normal breath sounds.  Musculoskeletal:     Cervical back: Normal range of motion and neck supple. No tenderness.     Right lower leg: No edema.     Left lower leg: No edema.  Lymphadenopathy:     Cervical: No cervical adenopathy.  Neurological:     General: No focal deficit present.     Mental Status: She is alert and oriented to person, place, and time.  Psychiatric:        Mood and Affect: Mood normal.       The 10-year ASCVD risk score (Arnett DK, et al., 2019) is: 5.1%    Assessment & Plan:  Desiree KitchenMarland KitchenTrisha was seen today for weight management screening.  Diagnoses and all orders for this visit:  Class 1 obesity due to excess calories with serious comorbidity and body mass index (BMI) of 33.0 to 33.9 in adult -  AMBULATORY NON FORMULARY MEDICATION; Semaglutide 2.5mg  with pyridoxine 10mg  per mL Inject 96 units for 2.4mg  of semaglutide once weekly -     CMP14+EGFR  Visit for screening mammogram -     MM 3D SCREENING MAMMOGRAM BILATERAL BREAST; Future  Hypertension, essential, benign -     CMP14+EGFR  Pre-diabetes -     CMP14+EGFR -     Hemoglobin A1c  Encounter for immunization -     Varicella-zoster vaccine IM   Need for screening labs Mammogram ordered Continue on wegovy to help with weight loss through compounding pharmacy Exercise 150 minutes a week Follow up in 6 months   Tandy Gaw, PA-C

## 2023-07-12 NOTE — Patient Instructions (Signed)
Compounded med solutions wegovy Continue same medications

## 2023-07-13 LAB — CMP14+EGFR
ALT: 16 [IU]/L (ref 0–32)
AST: 41 [IU]/L — ABNORMAL HIGH (ref 0–40)
Albumin: 4.1 g/dL (ref 3.9–4.9)
Alkaline Phosphatase: 58 [IU]/L (ref 44–121)
BUN/Creatinine Ratio: 22 (ref 9–23)
BUN: 20 mg/dL (ref 6–24)
Bilirubin Total: 0.3 mg/dL (ref 0.0–1.2)
CO2: 25 mmol/L (ref 20–29)
Calcium: 9.8 mg/dL (ref 8.7–10.2)
Chloride: 99 mmol/L (ref 96–106)
Creatinine, Ser: 0.89 mg/dL (ref 0.57–1.00)
Globulin, Total: 3 g/dL (ref 1.5–4.5)
Glucose: 94 mg/dL (ref 70–99)
Potassium: 3.9 mmol/L (ref 3.5–5.2)
Sodium: 139 mmol/L (ref 134–144)
Total Protein: 7.1 g/dL (ref 6.0–8.5)
eGFR: 79 mL/min/{1.73_m2} (ref >59)

## 2023-07-13 LAB — HEMOGLOBIN A1C
Est. average glucose Bld gHb Est-mCnc: 117 mg/dL
Hgb A1c MFr Bld: 5.7 % — ABNORMAL HIGH (ref 4.8–5.6)

## 2023-07-14 ENCOUNTER — Encounter: Payer: Self-pay | Admitting: Physician Assistant

## 2023-07-14 NOTE — Progress Notes (Signed)
A1C is 5.7 and still in pre-diabetes range.  AST just a little elevated.

## 2023-08-30 ENCOUNTER — Ambulatory Visit: Payer: No Typology Code available for payment source

## 2023-08-30 DIAGNOSIS — Z1231 Encounter for screening mammogram for malignant neoplasm of breast: Secondary | ICD-10-CM | POA: Diagnosis not present

## 2023-09-01 ENCOUNTER — Encounter: Payer: Self-pay | Admitting: Physician Assistant

## 2023-09-01 NOTE — Progress Notes (Signed)
 Normal mammogram. Follow up in 1 year.

## 2023-11-21 ENCOUNTER — Telehealth: Payer: Self-pay

## 2023-11-21 ENCOUNTER — Other Ambulatory Visit (HOSPITAL_COMMUNITY): Payer: Self-pay

## 2023-11-21 NOTE — Telephone Encounter (Signed)
 Pharmacy Patient Advocate Encounter   Received notification from CoverMyMeds that prior authorization for Wegovy  2.4mg /0.40ml is required/requested.   Insurance verification completed.   The patient is insured through [facility] .   Per test claim: Prior authorization expires on 05/17/2024.

## 2024-01-09 ENCOUNTER — Ambulatory Visit: Payer: No Typology Code available for payment source | Admitting: Physician Assistant

## 2024-01-16 ENCOUNTER — Ambulatory Visit (INDEPENDENT_AMBULATORY_CARE_PROVIDER_SITE_OTHER): Admitting: Physician Assistant

## 2024-01-16 ENCOUNTER — Encounter: Payer: Self-pay | Admitting: Physician Assistant

## 2024-01-16 VITALS — BP 129/76 | HR 86 | Ht 69.0 in | Wt 220.0 lb

## 2024-01-16 DIAGNOSIS — Z6833 Body mass index (BMI) 33.0-33.9, adult: Secondary | ICD-10-CM

## 2024-01-16 DIAGNOSIS — E6609 Other obesity due to excess calories: Secondary | ICD-10-CM

## 2024-01-16 DIAGNOSIS — Z13220 Encounter for screening for lipoid disorders: Secondary | ICD-10-CM

## 2024-01-16 DIAGNOSIS — Z23 Encounter for immunization: Secondary | ICD-10-CM | POA: Diagnosis not present

## 2024-01-16 DIAGNOSIS — I10 Essential (primary) hypertension: Secondary | ICD-10-CM

## 2024-01-16 DIAGNOSIS — E66811 Obesity, class 1: Secondary | ICD-10-CM | POA: Diagnosis not present

## 2024-01-16 DIAGNOSIS — R7303 Prediabetes: Secondary | ICD-10-CM

## 2024-01-16 DIAGNOSIS — Z780 Asymptomatic menopausal state: Secondary | ICD-10-CM

## 2024-01-16 LAB — POCT GLYCOSYLATED HEMOGLOBIN (HGB A1C): Hemoglobin A1C: 5.4 % (ref 4.0–5.6)

## 2024-01-16 MED ORDER — WEGOVY 2.4 MG/0.75ML ~~LOC~~ SOAJ: 2.4000 mg | SUBCUTANEOUS | 1 refills | Status: DC

## 2024-01-16 MED ORDER — VALSARTAN-HYDROCHLOROTHIAZIDE 320-25 MG PO TABS: 1.0000 | ORAL_TABLET | Freq: Every day | ORAL | 1 refills | Status: AC

## 2024-01-16 NOTE — Progress Notes (Unsigned)
 Established Patient Office Visit  Subjective   Patient ID: Desiree Lawson, female    DOB: 1970-10-19  Age: 53 y.o. MRN: [id number]  Chief Complaint  Patient presents with   Medical Management of Chronic Issues    HPI Pt is a 53 yo obese female with Pre-diabetes and HTN who presents to the clinic for follow up.   Her last A1C was 5.7. she is paying out of pocket for wegovy  for weight loss. She is on the 2.4mg  weekly dose. She has lost another 5lbs since January. She is walking more but July was very busy and she did not exercise as much. She feels like wegovy  is helping her a lot. She tried the compounded semaglutide  and did not feel like it helped like the wegovy . She denies any CP, palpitations, headaches or vision changes. She is not checking her BP at home.   Review of Systems  All other systems reviewed and are negative.     Objective:     BP 129/76   Pulse 86   Ht 5' 9 (1.753 m)   Wt 220 lb (99.8 kg)   SpO2 99%   BMI 32.49 kg/m  BP Readings from Last 3 Encounters:  01/16/24 129/76  07/12/23 139/88  01/09/23 120/70   Wt Readings from Last 3 Encounters:  01/16/24 220 lb (99.8 kg)  07/12/23 225 lb 4 oz (102.2 kg)  01/09/23 229 lb 1.9 oz (103.9 kg)      Physical Exam Constitutional:      Appearance: Normal appearance. She is obese.  Cardiovascular:     Rate and Rhythm: Normal rate and regular rhythm.     Pulses: Normal pulses.     Heart sounds: Normal heart sounds.  Pulmonary:     Effort: Pulmonary effort is normal.     Breath sounds: Normal breath sounds.  Neurological:     General: No focal deficit present.     Mental Status: She is alert and oriented to person, place, and time.  Psychiatric:        Mood and Affect: Mood normal.    .. Lab Results  Component Value Date   HGBA1C 5.4 01/16/2024     The 10-year ASCVD risk score (Arnett DK, et al., 2019) is: 3.7%    Assessment & Plan:  SABRASABRAEsthela was seen today for medical management of  chronic issues.  Diagnoses and all orders for this visit:  Class 1 obesity due to excess calories with serious comorbidity and body mass index (BMI) of 33.0 to 33.9 in adult -     WEGOVY  2.4 MG/0.75ML SOAJ; Inject 2.4 mg into the skin once a week.  Hypertension, essential, benign -     valsartan -hydrochlorothiazide  (DIOVAN -HCT) 320-25 MG tablet; Take 1 tablet by mouth daily. -     WEGOVY  2.4 MG/0.75ML SOAJ; Inject 2.4 mg into the skin once a week. -     CMP14+EGFR  Pre-diabetes -     POCT HgB A1C -     WEGOVY  2.4 MG/0.75ML SOAJ; Inject 2.4 mg into the skin once a week.  Post-menopausal -     VITAMIN D  25 Hydroxy (Vit-D Deficiency, Fractures)  Screening for lipid disorders -     Lipid panel  Need for shingles vaccine -     Varicella-zoster vaccine IM   Pt is doing well Vitals look great Diovan  HCT refilled today A1C dropped to 5.4 which is great Continue on wegovy  Continue to work on healthy diet and regular exercise Fasting  labs ordered Need pap smear Shingles vaccine 2nd dose given today     Return in about 6 months (around 07/18/2024).    [name], PA-C

## 2024-01-17 ENCOUNTER — Encounter: Payer: Self-pay | Admitting: Physician Assistant

## 2024-01-17 ENCOUNTER — Ambulatory Visit: Payer: Self-pay | Admitting: Physician Assistant

## 2024-01-17 LAB — CMP14+EGFR
ALT: 12 IU/L (ref 0–32)
AST: 40 IU/L (ref 0–40)
Albumin: 4.1 g/dL (ref 3.9–4.9)
Alkaline Phosphatase: 57 IU/L (ref 44–121)
BUN/Creatinine Ratio: 13 (ref 9–23)
BUN: 14 mg/dL (ref 6–24)
Bilirubin Total: 0.4 mg/dL (ref 0.0–1.2)
CO2: 24 mmol/L (ref 20–29)
Calcium: 9.9 mg/dL (ref 8.7–10.2)
Chloride: 100 mmol/L (ref 96–106)
Creatinine, Ser: 1.04 mg/dL — ABNORMAL HIGH (ref 0.57–1.00)
Globulin, Total: 2.9 g/dL (ref 1.5–4.5)
Glucose: 82 mg/dL (ref 70–99)
Potassium: 3.7 mmol/L (ref 3.5–5.2)
Sodium: 139 mmol/L (ref 134–144)
Total Protein: 7 g/dL (ref 6.0–8.5)
eGFR: 65 mL/min/1.73 (ref >59)

## 2024-01-17 LAB — LIPID PANEL
Chol/HDL Ratio: 3.5 ratio (ref 0.0–4.4)
Cholesterol, Total: 166 mg/dL (ref 100–199)
HDL: 48 mg/dL (ref >39)
LDL Chol Calc (NIH): 108 mg/dL — ABNORMAL HIGH (ref 0–99)
Triglycerides: 47 mg/dL (ref 0–149)
VLDL Cholesterol Cal: 10 mg/dL (ref 5–40)

## 2024-01-17 LAB — VITAMIN D 25 HYDROXY (VIT D DEFICIENCY, FRACTURES): Vit D, 25-Hydroxy: 22.9 ng/mL — ABNORMAL LOW (ref 30.0–100.0)

## 2024-01-17 NOTE — Progress Notes (Signed)
 Desiree Lawson,   LDL improved a little!  Kidney function dropped a little. Avoid regular use of anti-inflammatories, stay hydrated. Recheck in 6 months.  Vitamin D  not to goal. Make sure taking at least 2000 units of D3 daily with dairy for better absorption.

## 2024-04-04 ENCOUNTER — Encounter: Payer: Self-pay | Admitting: Physician Assistant

## 2024-04-04 DIAGNOSIS — L918 Other hypertrophic disorders of the skin: Secondary | ICD-10-CM

## 2024-04-04 DIAGNOSIS — R238 Other skin changes: Secondary | ICD-10-CM

## 2024-04-24 ENCOUNTER — Other Ambulatory Visit (HOSPITAL_COMMUNITY): Payer: Self-pay

## 2024-04-24 ENCOUNTER — Telehealth: Payer: Self-pay

## 2024-04-24 NOTE — Telephone Encounter (Signed)
 Pharmacy Patient Advocate Encounter   Received notification from CoverMyMeds that prior authorization for Wegovy  2.4mg /0.53ml is due for renewal.   Insurance verification completed.   The patient is insured through [facility].  Action: Patient hasn't been seen in your office since 01/2024. Plan requires updated chart notes for PA renewal.   PA renewal not submitted at this time.   Key: [id number]

## 2024-04-24 NOTE — Telephone Encounter (Signed)
Left message for patient to call back and schedule a follow up appointment.

## 2024-04-25 NOTE — Telephone Encounter (Signed)
 Called patient. Again left message requesting a return call by patient to schedule a visit for documentation purposes to continue Wegovy  prior authorization.

## 2024-05-14 ENCOUNTER — Encounter: Payer: Self-pay | Admitting: Physician Assistant

## 2024-05-14 ENCOUNTER — Ambulatory Visit (INDEPENDENT_AMBULATORY_CARE_PROVIDER_SITE_OTHER): Admitting: Physician Assistant

## 2024-05-14 DIAGNOSIS — I10 Essential (primary) hypertension: Secondary | ICD-10-CM | POA: Diagnosis not present

## 2024-05-14 DIAGNOSIS — R7303 Prediabetes: Secondary | ICD-10-CM

## 2024-05-14 MED ORDER — WEGOVY 2.4 MG/0.75ML ~~LOC~~ SOAJ: 2.4000 mg | SUBCUTANEOUS | 1 refills | Status: AC

## 2024-05-14 NOTE — Progress Notes (Signed)
 Established Patient Office Visit  Subjective   Patient ID: Desiree Lawson, female    DOB: March 18, 1971  Age: 53 y.o. MRN: [id number]  Chief Complaint  Patient presents with   Medical Management of Chronic Issues    HPI .SABRADiscussed the use of AI scribe software for clinical note transcription with the patient, who gave verbal consent to proceed.  History of Present Illness Desiree Lawson is a 53 year old female with obesity and hypertension who presents for weight management and blood pressure follow-up.  Weight gain and appetite control - Weight gain since August, attributed to missed doses of Wegovy  2.4 mg due to travel and decreased regular exercise - Difficulty maintaining routine during this time of year due to increased temptations and comfort food - Starting weight was 298 pounds - Actively working on lifestyle changes to aid in weight loss - Missed doses of Wegovy  2.4 mg result in decreased appetite control  Hypertension management - No chest pain - No shortness of breath - Takes Diovan  daily around 9-10 AM without missed doses   ROS See HPI.    Objective:     BP 136/78   Pulse 71   Ht 5' 9 (1.753 m)   Wt 227 lb (103 kg)   SpO2 99%   BMI 33.52 kg/m  BP Readings from Last 3 Encounters:  05/14/24 136/78  01/16/24 129/76  07/12/23 139/88   Wt Readings from Last 3 Encounters:  05/14/24 227 lb (103 kg)  01/16/24 220 lb (99.8 kg)  07/12/23 225 lb 4 oz (102.2 kg)      Physical Exam Constitutional:      Appearance: Normal appearance. She is obese.  HENT:     Head: Normocephalic.  Cardiovascular:     Rate and Rhythm: Normal rate and regular rhythm.  Pulmonary:     Effort: Pulmonary effort is normal.     Breath sounds: Normal breath sounds.  Musculoskeletal:     Right lower leg: No edema.     Left lower leg: No edema.  Neurological:     General: No focal deficit present.     Mental Status: She is alert and oriented to person,  place, and time.  Psychiatric:        Mood and Affect: Mood normal.      The 10-year ASCVD risk score (Arnett DK, et al., 2019) is: 4.7%    Assessment & Plan:  .Desiree Lawson was seen today for medical management of chronic issues.  Diagnoses and all orders for this visit:  Morbid obesity (HCC) -     WEGOVY  2.4 MG/0.75ML SOAJ SQ injection; Inject 2.4 mg into the skin once a week.  Hypertension, essential, benign -     WEGOVY  2.4 MG/0.75ML SOAJ SQ injection; Inject 2.4 mg into the skin once a week.  Pre-diabetes -     WEGOVY  2.4 MG/0.75ML SOAJ SQ injection; Inject 2.4 mg into the skin once a week.   Assessment & Plan Morbid Obesity Weight increased slightly. Missed Wegovy  doses due to travel. Considering Steglatro for better outcomes. Insurance coverage for Zepbound needs verification. - Verify insurance coverage for Zepbound and send a [employer] with results. - Continue Wegovy  2.4 mg until insurance verification is complete. - Encouraged lifestyle modifications for weight loss.  Essential Hypertension Blood pressure improved to 136/80 mmHg. No missed Diovan  doses. No chest pain or shortness of breath. - Continue Diovan  as prescribed.  General Health Maintenance Declined flu and COVID vaccinations. Plans to schedule Pap  smear. - Schedule Pap smear.   Return in about 6 months (around 11/12/2024).    Cendy Oconnor, PA-C

## 2024-05-14 NOTE — Patient Instructions (Signed)

## 2024-05-17 ENCOUNTER — Other Ambulatory Visit (HOSPITAL_COMMUNITY): Payer: Self-pay

## 2024-05-17 ENCOUNTER — Telehealth: Payer: Self-pay

## 2024-05-17 NOTE — Telephone Encounter (Signed)
 Clinical questions have been answered and PA submitted. PA currently Pending.

## 2024-05-17 NOTE — Telephone Encounter (Signed)
 Pharmacy Patient Advocate Encounter  Received notification from [facility] that Prior Authorization for Wegovy  2.4MG /0.75ML auto-injectors  has been APPROVED from 05/17/24 to 05/17/25   PA #/Case ID/Reference #: [id number]

## 2024-05-17 NOTE — Telephone Encounter (Signed)
 Pharmacy Patient Advocate Encounter   Received notification from Onbase that prior authorization for Wegovy  2.4MG /0.75ML auto-injectors is due for renewal.   Insurance verification completed.   The patient is insured through [facility].  Action: PA required; PA started via CoverMyMeds. KEY [id number] . Waiting for clinical questions to populate.

## 2024-07-19 ENCOUNTER — Ambulatory Visit: Admitting: Physician Assistant

## 2024-11-12 ENCOUNTER — Ambulatory Visit: Admitting: Physician Assistant
